# Patient Record
Sex: Male | Born: 2000 | Race: Black or African American | Hispanic: No | Marital: Single | Smoking: Current some day smoker
Health system: Southern US, Community
[De-identification: ages and names within clinical notes are randomized; demographics above are authoritative.]

## PROBLEM LIST (undated history)

## (undated) ENCOUNTER — Emergency Department (HOSPITAL_COMMUNITY): Payer: Medicaid Other

## (undated) DIAGNOSIS — Y249XXA Unspecified firearm discharge, undetermined intent, initial encounter: Secondary | ICD-10-CM

## (undated) DIAGNOSIS — W3400XA Accidental discharge from unspecified firearms or gun, initial encounter: Secondary | ICD-10-CM

## (undated) DIAGNOSIS — R569 Unspecified convulsions: Secondary | ICD-10-CM

## (undated) DIAGNOSIS — Z789 Other specified health status: Secondary | ICD-10-CM

## (undated) HISTORY — PX: ABDOMINAL SURGERY: SHX537

## (undated) HISTORY — PX: BACK SURGERY: SHX140

## (undated) HISTORY — PX: MOLE REMOVAL: SHX2046

## (undated) HISTORY — PX: HERNIA REPAIR: SHX51

---
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
## 2019-01-27 ENCOUNTER — Encounter (HOSPITAL_COMMUNITY): Payer: Self-pay | Admitting: Emergency Medicine

## 2019-01-27 ENCOUNTER — Other Ambulatory Visit: Payer: Self-pay

## 2019-01-27 ENCOUNTER — Emergency Department (HOSPITAL_COMMUNITY)
Admission: EM | Admit: 2019-01-27 | Discharge: 2019-01-27 | Disposition: A | Discharge status: Home / Self Care | Payer: Self-pay | Attending: Emergency Medicine | Admitting: Emergency Medicine

## 2019-01-27 ENCOUNTER — Emergency Department (HOSPITAL_COMMUNITY): Payer: Self-pay

## 2019-01-27 DIAGNOSIS — S00512A Abrasion of oral cavity, initial encounter: Secondary | ICD-10-CM | POA: Insufficient documentation

## 2019-01-27 DIAGNOSIS — Y929 Unspecified place or not applicable: Secondary | ICD-10-CM | POA: Insufficient documentation

## 2019-01-27 DIAGNOSIS — Y9384 Activity, sleeping: Secondary | ICD-10-CM | POA: Insufficient documentation

## 2019-01-27 DIAGNOSIS — R569 Unspecified convulsions: Secondary | ICD-10-CM | POA: Insufficient documentation

## 2019-01-27 DIAGNOSIS — Y999 Unspecified external cause status: Secondary | ICD-10-CM | POA: Insufficient documentation

## 2019-01-27 DIAGNOSIS — Y9389 Activity, other specified: Secondary | ICD-10-CM | POA: Insufficient documentation

## 2019-01-27 DIAGNOSIS — X58XXXA Exposure to other specified factors, initial encounter: Secondary | ICD-10-CM | POA: Insufficient documentation

## 2019-01-27 DIAGNOSIS — Z7722 Contact with and (suspected) exposure to environmental tobacco smoke (acute) (chronic): Secondary | ICD-10-CM | POA: Insufficient documentation

## 2019-01-27 LAB — COMPREHENSIVE METABOLIC PANEL
ALT: 13 U/L (ref 0–44)
AST: 19 U/L (ref 15–41)
Albumin: 4.2 g/dL (ref 3.5–5.0)
Alkaline Phosphatase: 59 U/L (ref 38–126)
Anion gap: 8 (ref 5–15)
BUN: 8 mg/dL (ref 6–20)
CO2: 22 mmol/L (ref 22–32)
Calcium: 9.2 mg/dL (ref 8.9–10.3)
Chloride: 107 mmol/L (ref 98–111)
Creatinine, Ser: 1.07 mg/dL (ref 0.61–1.24)
GFR calc Af Amer: >60 mL/min (ref >60)
GFR calc non Af Amer: >60 mL/min (ref >60)
Glucose, Bld: 101 mg/dL — ABNORMAL HIGH (ref 70–99)
Potassium: 4 mmol/L (ref 3.5–5.1)
Sodium: 137 mmol/L (ref 135–145)
Total Bilirubin: 1.3 mg/dL — ABNORMAL HIGH (ref 0.3–1.2)
Total Protein: 6.8 g/dL (ref 6.5–8.1)

## 2019-01-27 LAB — CBC WITH DIFFERENTIAL/PLATELET
Abs Immature Granulocytes: 0.01 10*3/uL (ref 0.00–0.07)
Basophils Absolute: 0 10*3/uL (ref 0.0–0.1)
Basophils Relative: 1 %
Eosinophils Absolute: 0.1 10*3/uL (ref 0.0–0.5)
Eosinophils Relative: 2 %
HCT: 45.4 % (ref 39.0–52.0)
Hemoglobin: 14.3 g/dL (ref 13.0–17.0)
Immature Granulocytes: 0 %
Lymphocytes Relative: 32 %
Lymphs Abs: 1.6 10*3/uL (ref 0.7–4.0)
MCH: 29.5 pg (ref 26.0–34.0)
MCHC: 31.5 g/dL (ref 30.0–36.0)
MCV: 93.6 fL (ref 80.0–100.0)
Monocytes Absolute: 0.4 10*3/uL (ref 0.1–1.0)
Monocytes Relative: 7 %
Neutro Abs: 2.9 10*3/uL (ref 1.7–7.7)
Neutrophils Relative %: 58 %
Platelets: 241 10*3/uL (ref 150–400)
RBC: 4.85 MIL/uL (ref 4.22–5.81)
RDW: 11 % — ABNORMAL LOW (ref 11.5–15.5)
WBC: 5 10*3/uL (ref 4.0–10.5)
nRBC: 0 % (ref 0.0–0.2)

## 2019-01-27 LAB — URINALYSIS, ROUTINE W REFLEX MICROSCOPIC
Bacteria, UA: NONE SEEN
Bilirubin Urine: NEGATIVE
Glucose, UA: NEGATIVE mg/dL
Ketones, ur: NEGATIVE mg/dL
Nitrite: NEGATIVE
Protein, ur: NEGATIVE mg/dL
Specific Gravity, Urine: 1.017 (ref 1.005–1.030)
pH: 5 (ref 5.0–8.0)

## 2019-01-27 LAB — RAPID URINE DRUG SCREEN, HOSP PERFORMED
Amphetamines: NOT DETECTED
Barbiturates: NOT DETECTED
Benzodiazepines: NOT DETECTED
Cocaine: NOT DETECTED
Opiates: NOT DETECTED
Tetrahydrocannabinol: POSITIVE — AB

## 2019-01-27 LAB — MAGNESIUM: Magnesium: 2.1 mg/dL (ref 1.7–2.4)

## 2019-01-27 LAB — CBG MONITORING, ED: Glucose-Capillary: 95 mg/dL (ref 70–99)

## 2019-01-27 NOTE — ED Notes (Signed)
Pt is NSR on monitor 

## 2019-01-27 NOTE — ED Notes (Signed)
Patient transported to CT 

## 2019-01-27 NOTE — ED Notes (Signed)
IV removed, vitals done, ready for D/C

## 2019-01-27 NOTE — Discharge Instructions (Signed)
You may not drive until cleared by the neurologist.  Do not do other activities that could put yourself in harm's way if you were to have another seizure such as climbing a ladder, swimming in a pool by herself, taking a bath by herself, etc.  If you develop another seizure or seizure-like activity, call 911 or return to the ER for evaluation.

## 2019-01-27 NOTE — ED Notes (Signed)
Discharge instructions discussed with Pt. Pt verbalized understanding. Pt stable and ambulatory. Pt's mother is picking him up.

## 2019-01-27 NOTE — ED Triage Notes (Signed)
Pt presents from home with [facility]. Family found pt with seizure-like activity in bed this morning. EMS noted post-ictal state, blood in mouth (bit tongue), incontinence, foam at mouth. Arrives A/O x4. No h/o seizures. H/o episode after wisdom teeth removal, trouble waking from anaesthesia, required needing a cardiac stent at [facility] d/t this.

## 2019-01-27 NOTE — ED Provider Notes (Signed)
[facility] Provider Note   CSN: [id number] Arrival date & time: 01/27/19  0757    History   Chief Complaint Chief Complaint  Patient presents with  . Seizures    HPI Jonathan [name] is a 18 y.o. male.     HPI 18 year old male presents with seizure.  History obtained from the nurse as EMS is no longer present.  The patient also contributes but does not remember what happened.  He was asleep and girlfriend saw him having seizure-like activity.  He apparently bit his tongue and was initially postictal.  He was foaming at the mouth and incontinent of urine.  No prior history of seizures.  The patient states all he remembers was waking up and EMS was present.  No recent palpitations.  He has not been sick recently including no fever, headache, vomiting, diarrhea, chest pain.  No alcohol use.  He uses marijuana but no other illicit drugs.  Past Medical History:  Diagnosis Date  . Medical history non-contributory     Patient Active Problem List   Diagnosis Date Noted  . Ventricular tachycardia (HCC) 05/23/2015  . Arrhythmia 05/23/2015    Past Surgical History:  Procedure Laterality Date  . HERNIA REPAIR    . MOLE REMOVAL          Home Medications    Prior to Admission medications   Medication Sig Start Date End Date Taking? Authorizing Provider  acetaminophen (TYLENOL) 325 MG tablet Take 2 tablets (650 mg total) by mouth every 6 (six) hours as needed. Patient not taking: Reported on 01/27/2019 12/08/17   Aviva KluverMurray, Alyssa B, PA-C  ibuprofen (ADVIL,MOTRIN) 600 MG tablet Take 1 tablet (600 mg total) by mouth every 6 (six) hours as needed. Patient not taking: Reported on 01/27/2019 12/08/17   Elisha PonderMurray, Alyssa B, PA-C    Family History Family History  Problem Relation Age of Onset  . Heart disease Paternal Grandmother        fluid around heart    Social History Social History   Tobacco Use  . Smoking status: Passive Smoke Exposure -  Never Smoker  . Smokeless tobacco: Never Used  Substance Use Topics  . Alcohol use: No  . Drug use: No     Allergies   Propofol   Review of Systems Review of Systems  Constitutional: Negative for fever.  Cardiovascular: Negative for chest pain and palpitations.  Gastrointestinal: Negative for abdominal pain, diarrhea and vomiting.  Neurological: Positive for seizures. Negative for headaches.  Psychiatric/Behavioral: Negative for sleep disturbance.  All other systems reviewed and are negative.    Physical Exam Updated Vital Signs BP (!) 121/58   Pulse (!) 57   Temp 97.7 F (36.5 C) (Oral)   Resp (!) 29   Ht 5\' 11"  (1.803 m)   Wt 74.8 kg   SpO2 99%   BMI 23.01 kg/m   Physical Exam Vitals signs and nursing note reviewed.  Constitutional:      General: He is not in acute distress.    Appearance: He is well-developed. He is not ill-appearing or diaphoretic.  HENT:     Head: Normocephalic and atraumatic.     Comments: Small right lateral tongue abrasion    Right Ear: External ear normal.     Left Ear: External ear normal.     Nose: Nose normal.  Eyes:     General:        Right eye: No discharge.  Left eye: No discharge.     Extraocular Movements: Extraocular movements intact.     Pupils: Pupils are equal, round, and reactive to light.  Neck:     Musculoskeletal: Neck supple.  Cardiovascular:     Rate and Rhythm: Normal rate and regular rhythm.     Heart sounds: Normal heart sounds.  Pulmonary:     Effort: Pulmonary effort is normal.     Breath sounds: Normal breath sounds.  Abdominal:     Palpations: Abdomen is soft.     Tenderness: There is no abdominal tenderness.  Skin:    General: Skin is warm and dry.  Neurological:     Mental Status: He is alert.     Comments: CN 3-12 grossly intact. 5/5 strength in all 4 extremities. Grossly normal sensation. Normal finger to nose.   Psychiatric:        Mood and Affect: Mood is not anxious.      ED  Treatments / Results  Labs (all labs ordered are listed, but only abnormal results are displayed) Labs Reviewed  CBC WITH DIFFERENTIAL/PLATELET - Abnormal; Notable for the following components:      Result Value   RDW 11.0 (*)    All other components within normal limits  COMPREHENSIVE METABOLIC PANEL - Abnormal; Notable for the following components:   Glucose, Bld 101 (*)    Total Bilirubin 1.3 (*)    All other components within normal limits  URINALYSIS, ROUTINE W REFLEX MICROSCOPIC - Abnormal; Notable for the following components:   APPearance HAZY (*)    Hgb urine dipstick SMALL (*)    Leukocytes,Ua TRACE (*)    All other components within normal limits  RAPID URINE DRUG SCREEN, HOSP PERFORMED - Abnormal; Notable for the following components:   Tetrahydrocannabinol POSITIVE (*)    All other components within normal limits  MAGNESIUM  CBG MONITORING, ED    EKG EKG Interpretation  Date/Time:  Tuesday January 27 2019 08:01:17 EDT Ventricular Rate:  72 PR Interval:    QRS Duration: 101 QT Interval:  389 QTC Calculation: 426 R Axis:   79 Text Interpretation:  Sinus rhythm RSR' in V1 or V2, probably normal variant ST elevation is likely early repol Confirmed by Sherwood Gambler [phone]) on 01/27/2019 8:26:44 AM   Radiology Ct Head Wo Contrast  Result Date: 01/27/2019 CLINICAL DATA:  Seizure. EXAM: CT HEAD WITHOUT CONTRAST TECHNIQUE: Contiguous axial images were obtained from the base of the skull through the vertex without intravenous contrast. COMPARISON:  None. FINDINGS: Brain: No evidence of acute infarction, hemorrhage, hydrocephalus, extra-axial collection or mass lesion/mass effect. Vascular: No hyperdense vessel or unexpected calcification. Skull: Normal. Negative for fracture or focal lesion. Sinuses/Orbits: No acute finding. Other: None. IMPRESSION: No acute intracranial abnormality. Consider further evaluation with MRI, as clinically warranted. Electronically Signed   By:  Davina Poke M.D.   On: 01/27/2019 09:17    Procedures Procedures (including critical care time)  Medications Ordered in ED Medications - No data to display   Initial Impression / Assessment and Plan / ED Course  I have reviewed the triage vital signs and the nursing notes.  Pertinent labs & imaging results that were available during my care of the patient were reviewed by me and considered in my medical decision making (see chart for details).        Work-up is noncontributory towards why he had a first-time seizure.  Neuro exam is benign.  Labs reassuring.  I discussed he cannot drive  for now until cleared by neurology and otherwise we discussed stopping smoking, increasing sleep, and follow-up with neurology.  Discussed return precautions.  Final Clinical Impressions(s) / ED Diagnoses   Final diagnoses:  First time seizure [facility](HCC)    ED Discharge Orders         Ordered    Ambulatory referral to Neurology    Comments: An appointment is requested in approximately: 2 weeks   01/27/19 1127           Pricilla LovelessGoldston, Kikuye Korenek, MD 01/27/19 1129

## 2019-01-27 NOTE — ED Notes (Addendum)
Pt family (mother) updated by pt using room phone. Pt aware of need for urine sample, urinal at bedside. Seizure pads applied

## 2019-01-29 ENCOUNTER — Ambulatory Visit: Payer: Medicaid Other | Admitting: Neurology

## 2019-02-02 ENCOUNTER — Encounter: Payer: Self-pay | Admitting: Neurology

## 2019-02-07 ENCOUNTER — Emergency Department (HOSPITAL_COMMUNITY)
Admission: EM | Admit: 2019-02-07 | Discharge: 2019-02-07 | Disposition: A | Discharge status: Home / Self Care | Payer: Medicaid Other | Attending: Emergency Medicine | Admitting: Emergency Medicine

## 2019-02-07 ENCOUNTER — Encounter (HOSPITAL_COMMUNITY): Payer: Self-pay

## 2019-02-07 ENCOUNTER — Other Ambulatory Visit: Payer: Self-pay

## 2019-02-07 DIAGNOSIS — R569 Unspecified convulsions: Secondary | ICD-10-CM | POA: Insufficient documentation

## 2019-02-07 DIAGNOSIS — Z7722 Contact with and (suspected) exposure to environmental tobacco smoke (acute) (chronic): Secondary | ICD-10-CM | POA: Insufficient documentation

## 2019-02-07 HISTORY — DX: Unspecified convulsions: R56.9

## 2019-02-07 LAB — CBG MONITORING, ED: Glucose-Capillary: 104 mg/dL — ABNORMAL HIGH (ref 70–99)

## 2019-02-07 MED ORDER — LEVETIRACETAM IN NACL 1500 MG/100ML IV SOLN
1500.0000 mg | INTRAVENOUS | Status: AC
  Administered 2019-02-07: 1500 mg via INTRAVENOUS
  Filled 2019-02-07: qty 100

## 2019-02-07 MED ORDER — LEVETIRACETAM 500 MG PO TABS: 500.0000 mg | ORAL_TABLET | Freq: Two times a day (BID) | ORAL | 0 refills | Status: DC

## 2019-02-07 MED ORDER — LORAZEPAM 2 MG/ML IJ SOLN
INTRAMUSCULAR | Status: AC
  Administered 2019-02-07: 2 mg
  Filled 2019-02-07: qty 2

## 2019-02-07 NOTE — ED Notes (Signed)
MD Steinl at bedside. 

## 2019-02-07 NOTE — ED Provider Notes (Addendum)
[facility] EMERGENCY DEPARTMENT Provider Note   CSN: [id number] Arrival date & time: 02/07/19  1100     History   Chief Complaint Chief Complaint  Patient presents with  . Seizures    HPI Jonathan [name] is a 18 y.o. male.     Patient c/o seizure this AM, arrives via EMS. Pt states he awoke this AM per normal, took shower, went back to bed, and was noted to have approximately 2-3 minute generalized seizure while in bed. Patient was briefly post-ictal, but now has returned to baseline normal mental status. No incontinence or oral injury. Denies sleep deprivation. Has been eating/drinking per normal. No recent wt change. Denies fever/chills. No uri symptoms. No dysuria or gu c/o. No nvd. Denies recent head trauma. No headache. No change in speech or vision. No numbness,weakness, or loss of normal functional ability. Remote hx ablation for svt - no recurrent episodes since. No palpitations or syncope. Pt was seen in ED 01/27/19 after his first seizure, ct then neg, labs unremarkable except UDS + THC.   The history is provided by the patient.  Seizures   Past Medical History:  Diagnosis Date  . Medical history non-contributory   . Seizures [facility])     Patient Active Problem List   Diagnosis Date Noted  . Ventricular tachycardia ([place]) 05/23/2015  . Arrhythmia 05/23/2015    Past Surgical History:  Procedure Laterality Date  . HERNIA REPAIR    . MOLE REMOVAL          Home Medications    Prior to Admission medications   Medication Sig Start Date End Date Taking? Authorizing Provider  acetaminophen (TYLENOL) 325 MG tablet Take 2 tablets (650 mg total) by mouth every 6 (six) hours as needed. Patient not taking: Reported on 01/27/2019 12/08/17   Langston Masker B, PA-C  ibuprofen (ADVIL,MOTRIN) 600 MG tablet Take 1 tablet (600 mg total) by mouth every 6 (six) hours as needed. Patient not taking: Reported on 01/27/2019 12/08/17   Albesa Seen, PA-C    Family  History Family History  Problem Relation Age of Onset  . Heart disease Paternal Grandmother        fluid around heart    Social History Social History   Tobacco Use  . Smoking status: Passive Smoke Exposure - Never Smoker  . Smokeless tobacco: Never Used  Substance Use Topics  . Alcohol use: No  . Drug use: No     Allergies   Propofol   Review of Systems Review of Systems  Constitutional: Negative for chills and fever.  HENT: Negative for sore throat.   Eyes: Negative for pain and visual disturbance.  Respiratory: Negative for cough and shortness of breath.   Cardiovascular: Negative for chest pain.  Gastrointestinal: Negative for abdominal pain, nausea and vomiting.  Endocrine: Negative for polyuria.  Genitourinary: Negative for dysuria and flank pain.  Musculoskeletal: Negative for back pain and neck pain.  Skin: Negative for rash and wound.  Neurological: Positive for seizures. Negative for speech difficulty, weakness, numbness and headaches.  Hematological: Does not bruise/bleed easily.  Psychiatric/Behavioral: Negative for confusion.     Physical Exam Updated Vital Signs BP 128/65   Pulse 80   Temp 97.9 F (36.6 C) (Oral)   Resp 15   Ht 1.803 m (5\' 11" )   Wt 74.8 kg   SpO2 96%   BMI 23.01 kg/m   Physical Exam Vitals signs and nursing note reviewed.  Constitutional:  Appearance: Normal appearance. He is well-developed.  HENT:     Head: Atraumatic.     Nose: Nose normal.     Mouth/Throat:     Mouth: Mucous membranes are moist.     Pharynx: Oropharynx is clear.  Eyes:     General: No scleral icterus.    Conjunctiva/sclera: Conjunctivae normal.     Pupils: Pupils are equal, round, and reactive to light.  Neck:     Musculoskeletal: Normal range of motion and neck supple. No neck rigidity or muscular tenderness.     Vascular: No carotid bruit.     Trachea: No tracheal deviation.  Cardiovascular:     Rate and Rhythm: Normal rate and regular  rhythm.     Pulses: Normal pulses.     Heart sounds: Normal heart sounds. No murmur. No friction rub. No gallop.   Pulmonary:     Effort: Pulmonary effort is normal. No accessory muscle usage or respiratory distress.     Breath sounds: Normal breath sounds.  Abdominal:     General: Bowel sounds are normal. There is no distension.     Palpations: Abdomen is soft. There is no mass.     Tenderness: There is no abdominal tenderness. There is no guarding or rebound.     Hernia: No hernia is present.  Genitourinary:    Comments: No cva tenderness. Musculoskeletal:        General: No swelling or tenderness.     Right lower leg: No edema.     Left lower leg: No edema.     Comments: CTLS spine, non tender, aligned, no step off. Good rom bil ext without pain or focal bony tenderness.   Skin:    General: Skin is warm and dry.     Findings: No rash.  Neurological:     Mental Status: He is alert.     Comments: Alert, speech clear/fluent. Oriented. Motor intact bil, stre 5/5. sens grossly intact. Steady gait.    Psychiatric:        Mood and Affect: Mood normal.      ED Treatments / Results  Labs (all labs ordered are listed, but only abnormal results are displayed) Labs Reviewed - No data to display  EKG None  Radiology No results found.  Procedures Procedures (including critical care time)  Medications Ordered in ED Medications - No data to display   Initial Impression / Assessment and Plan / ED Course  I have reviewed the triage vital signs and the nursing notes.  Pertinent labs & imaging results that were available during my care of the patient were reviewed by me and considered in my medical decision making (see chart for details).  Seizure precautions. Monitor.   Reviewed nursing notes and prior charts for additional history. Recent ED eval for same reviewed - ct head and blood work unremarkable that visit.   Neurology consulted.   While awaiting neurology call  back, patient with generalized tonic-clonic seizure in ED, while on stretcher. No incontinence. No fall. Ativan 2 mg iv. Seizure lasted approx 1-2 minutes. Patient postictal.  Discussed pt with neurologist, Dr Otelia LimesLindzen, including patients 2nd and 3rd seizures today, prior workup, pt wt - he indicates to give keppra 1500 mg iv, and start on 500 mg bid. He can f/u with neurology as outpt, outpt eeg, seizure precautions/no driving.   Discussed w pt, avoid any drug use, including thc (as well as any other synthetic drugs, kraton, spice, etc).  Also instructed no driving,  no operating heavy machinery, no swimming, until cleared to do so by neurology.   Mom indicates patient had appt w neurology last week, but that he skipped appt. She indicates she will ensure he makes and goes to new appt this week. Also discussed need to avoid thc, or any drug use as they can cause seizures.   Pt ambulatory in ED. No distress. Awake and alert. Normal vitals.   CRITICAL CARE RE: recurrent seizures in ED requiring IV seizure medication.  Performed by: Suzi RootsKevin E Aruna Nestler Total critical care time: 40 minutes Critical care time was exclusive of separately billable procedures and treating other patients. Critical care was necessary to treat or prevent imminent or life-threatening deterioration. Critical care was time spent personally by me on the following activities: development of treatment plan with patient and/or surrogate as well as nursing, discussions with consultants, evaluation of patient's response to treatment, examination of patient, obtaining history from patient or surrogate, ordering and performing treatments and interventions, ordering and review of laboratory studies, ordering and review of radiographic studies, pulse oximetry and re-evaluation of patient's condition.    Final Clinical Impressions(s) / ED Diagnoses   Final diagnoses:  None    ED Discharge Orders    None           Cathren LaineSteinl, Blake Vetrano, MD  02/07/19 [phone]

## 2019-02-07 NOTE — ED Notes (Addendum)
Pt asked for a sip of water, this RN went to get water he could rinse his mouth with since he had recently bit his tongue and when this RN walked back into the room pt was actively seizing & it was 1125. Yankauer suction was started to his mouth due to blood noted with saliva draining from his mouth, Mina PA was told this RN needed help & she ordered 4 mg IV ativan to be pulled & 2 mg of the Ativan to be given to see how that affects (given at 1134). Pt was moving around, flailing his whole body around on the bed while grabbing for things instead of just flinging his hands across things, no eye contact, no convulsion/tremor like movements noted. By 1140 pt was still, asleep & VSS.

## 2019-02-07 NOTE — Discharge Instructions (Signed)
It was our pleasure to provide your ER care today - we hope that you feel better.  Take keppra (seizure medication) as prescribed.   Avoid using marijuana, spice, kratom, or any other drug as they can lower the seizure threshold and cause seizures.   No driving, operating heavy machinery, swimming, climbing ladders, or other potentially dangerous activity until cleared to do so by your doctor/neurologist.   Follow up with neurologist in their office this coming week - call Monday AM to arrange appointment.   Return to ER if worse, recurrent seizures, fevers, new or severe pain, or other concern.

## 2019-02-07 NOTE — ED Triage Notes (Signed)
Pt stated that he took a shower and then laid down to sleep & he woke up in the ambulance. EMS reported that he was seen by family having a grad mal seizure that lasted approx. 3 min, this is reported to be his 2nd seizure he has ever had. Upon pts arrival to ED he is A/Ox4.

## 2019-02-11 ENCOUNTER — Ambulatory Visit (INDEPENDENT_AMBULATORY_CARE_PROVIDER_SITE_OTHER): Payer: Self-pay | Admitting: Internal Medicine

## 2019-02-11 ENCOUNTER — Other Ambulatory Visit: Payer: Self-pay | Admitting: Internal Medicine

## 2019-02-11 ENCOUNTER — Other Ambulatory Visit: Payer: Self-pay

## 2019-02-11 ENCOUNTER — Encounter: Payer: Self-pay | Admitting: Internal Medicine

## 2019-02-11 DIAGNOSIS — Z79899 Other long term (current) drug therapy: Secondary | ICD-10-CM

## 2019-02-11 DIAGNOSIS — I471 Supraventricular tachycardia: Secondary | ICD-10-CM

## 2019-02-11 DIAGNOSIS — Z8679 Personal history of other diseases of the circulatory system: Secondary | ICD-10-CM

## 2019-02-11 DIAGNOSIS — G40909 Epilepsy, unspecified, not intractable, without status epilepticus: Secondary | ICD-10-CM

## 2019-02-11 NOTE — Progress Notes (Signed)
   CC: Establish care, seizure disorder (new diagnosis).  HPI:  [name] is a 18 y.o. with no significant past medical history presenting to establish care and also follow-up after experiencing new onset seizures.  Please see problem based charting for further details.  Past Medical History:  Diagnosis Date  . Medical history non-contributory   . Seizures ([place])    Review of Systems:   Review of Systems  Constitutional: Negative.   HENT: Negative.   Eyes: Negative.   Respiratory: Negative.   Cardiovascular: Negative.   Gastrointestinal: Negative.   Skin: Negative.   Neurological: Positive for seizures.  Psychiatric/Behavioral: Negative.    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  Physical Exam:  Vitals:   02/11/19 1531  BP: 127/66  Pulse: 61  Temp: 98.6 F (37 C)  TempSrc: Oral  SpO2: 100%  Weight: 162 lb 9.6 oz (73.8 kg)   Physical Exam Vitals signs and nursing note reviewed.  Constitutional:      Appearance: Normal appearance. He is normal weight.  HENT:     Head: Normocephalic and atraumatic.     Nose: Nose normal.  Eyes:     Conjunctiva/sclera: Conjunctivae normal.  Neck:     Musculoskeletal: Normal range of motion and neck supple.  Cardiovascular:     Rate and Rhythm: Normal rate.     Heart sounds: Normal heart sounds. No murmur. No gallop.   Pulmonary:     Effort: Pulmonary effort is normal.     Breath sounds: Normal breath sounds. No wheezing or rhonchi.  Neurological:     General: No focal deficit present.     Mental Status: He is alert and oriented to person, place, and time. Mental status is at baseline.     Cranial Nerves: No cranial nerve deficit.     Sensory: No sensory deficit.     Motor: No  weakness.     Gait: Gait normal.  Psychiatric:     Comments: Frustrated after arguing with mom.      Assessment & Plan:   See Encounters Tab for problem based charting.  Patient discussed with Dr. Evette Doffing

## 2019-02-11 NOTE — Assessment & Plan Note (Addendum)
Seizure disorder (new diagnosis): Mr. Parenteau was in his usual state of health until January 27, 2019 when he experienced general tonic-clonic seizure while he was at his girlfriend's house.  During that episode he had urinary incontinence and tongue laceration.  He presented to the emergency department following this episode and in the ED, CMP was unremarkable, CBC was unremarkable.  CT head showed no acute intracranial abnormalities.  He was fine and actually went to play basketball 2 days after the incident.  On February 07, 2019 he had another seizure episode and presented to the emergency department by EMS.  While in the ED he had a subsequent seizure lasting for about 1 to 2 minutes, generalized tonic-clonic.  He received Ativan 2 mg.  Neurology was consulted and advised to start patient on IV Keppra load of 1500 mg and maintenance dose of Keppra 500 mg oral twice daily.  Since then, he has been compliant with his medication and has had no subsequent seizure-like disorders.  He denies headaches, chest pain, abdominal pain, blurry vision, weakness, lightheadedness.  Plan: -Continue Keppra 500 mg twice daily - Follow-up appointment with neurology (02/12/2019) -Advised to avoid driving and triggers to seizure disorder including sleep deprivation etc

## 2019-02-11 NOTE — Assessment & Plan Note (Signed)
Hx of pSVT & wide-complex tachycardia: In 2016, he experienced paroxysmal SVT and wide-complex tachycardia after he had undergone oral surgery.  Per chart review, this episode had lasted for more than 2 hours.  Echocardiogram was unremarkable and he has no family history of sudden cardiac death, cardiac arrhythmias, Brugada or any cardiac abnormalities.  In January 2017, he underwent ablation of accessory pathway at [facility].  Since then, he has not had any subsequent episodes of tachycardia.  He denies shortness of breath, chest pain, lightheadedness, dizziness or syncope.

## 2019-02-11 NOTE — Assessment & Plan Note (Signed)
" >>  ASSESSMENT AND PLAN FOR ARRHYTHMIA WRITTEN ON 02/11/2019  4:21 PM BY Shawnelle Spoerl K, MD  Hx of pSVT & wide-complex tachycardia: In 2016, he experienced paroxysmal SVT and wide-complex tachycardia after he had undergone oral surgery.  Per chart review, this episode had lasted for more than 2 hours.  Echocardiogram was unremarkable and he has no family history of sudden cardiac death, cardiac arrhythmias, Brugada or any cardiac abnormalities.  In January 2017, he underwent ablation of accessory pathway at [facility].  Since then, he has not had any subsequent episodes of tachycardia.  He denies shortness of breath, chest pain, lightheadedness, dizziness or syncope. "

## 2019-02-11 NOTE — Patient Instructions (Addendum)
Hi Jonathan [name],  It was a pleasure seeing you here at the clinic today and also looking forward to be your primary doctor.  Overall, you seem to be doing very well and I am glad that you are taking your seizure medications.  Also, please don't drive at least for the next 6 months or until you see the neurologist  Please keep the neurology appointment tomorrow and follow-up with them as recommended.  Take care of yourself, continue to exercise, continue to play basketball and come and see [place] if anything changes.  Take Care! Dr. Eileen Stanford  Please call the internal medicine center clinic if you have any questions or concerns, we may be able to help and keep you from a long and expensive emergency room wait. Our clinic and after hours phone number is [phone], the best time to call is Monday through Friday 9 am to 4 pm but there is always someone available 24/7 if you have an emergency. If you need medication refills please notify your pharmacy one week in advance and they will send [place] a request.

## 2019-02-12 ENCOUNTER — Encounter: Payer: Self-pay | Admitting: Neurology

## 2019-02-12 ENCOUNTER — Telehealth: Payer: Self-pay | Admitting: Neurology

## 2019-02-12 ENCOUNTER — Ambulatory Visit: Payer: Self-pay | Admitting: Neurology

## 2019-02-12 VITALS — BP 132/71 | HR 67 | Temp 97.6°F | Ht 72.0 in | Wt 164.0 lb

## 2019-02-12 DIAGNOSIS — R569 Unspecified convulsions: Secondary | ICD-10-CM

## 2019-02-12 MED ORDER — LEVETIRACETAM 500 MG PO TABS: 500.0000 mg | ORAL_TABLET | Freq: Two times a day (BID) | ORAL | 1 refills | Status: DC

## 2019-02-12 NOTE — Progress Notes (Signed)
Internal Medicine Clinic Attending  Case discussed with Dr. Agyei at the time of the visit.  We reviewed the resident's history and exam and pertinent patient test results.  I agree with the assessment, diagnosis, and plan of care documented in the resident's note.    

## 2019-02-12 NOTE — Telephone Encounter (Signed)
self pay order sent to GI. They will reach out to the patient to schedule.  °

## 2019-02-12 NOTE — Progress Notes (Signed)
Reason for visit: Seizures  Referring physician: Arial  Rod HollerChristian R [name] is a 18 y.o. male  History of present illness:  Jonathan [name] is a 18 year old right-handed black male with a history of 2 recent seizures.  The patient had a seizure on 27 January 2019 while in bed early in the morning, the second seizure occurred on 07 February 2019 when he was awake, he was about ready to help his girlfriend move some furniture.  The patient was noted to suddenly blackout without warning, he had generalized jerking and bit his tongue.  The first seizure was associated with urinary incontinence.  The patient reports no family history of seizures and he denies any prior history of head trauma.  He did undergo a CT scan of the brain in the emergency room that was unremarkable, urine drug screen was positive for THC but otherwise unremarkable.  The patient reports no headaches, dizziness, numbness or weakness of extremities, or any balance changes.  He has never had a seizure event prior to 21 July.  He is sent to this office for an evaluation.  He has been placed on Keppra and he is tolerating the medication well.  He works as a Conservation officer, naturecashier at [employer].  Past Medical History:  Diagnosis Date  . Medical history non-contributory   . Seizures ([facility])     Past Surgical History:  Procedure Laterality Date  . HERNIA REPAIR    . MOLE REMOVAL      Family History  Problem Relation Age of Onset  . Heart disease Paternal Grandmother        fluid around heart    Social history:  reports that he has been smoking cigars. He has never used smokeless tobacco. He reports current drug use. Drug: Marijuana. He reports that he does not drink alcohol.  Medications:  Prior to Admission medications   Medication Sig Start Date End Date Taking? Authorizing Provider  acetaminophen (TYLENOL) 325 MG tablet Take 2 tablets (650 mg total) by mouth every 6 (six) hours as needed. 12/08/17  Yes Dayton ScrapeMurray, Alyssa B, PA-C  ibuprofen  (ADVIL,MOTRIN) 600 MG tablet Take 1 tablet (600 mg total) by mouth every 6 (six) hours as needed. 12/08/17  Yes [place] ScrapeMurray, Alyssa B, PA-C  levETIRAcetam (KEPPRA) 500 MG tablet Take 1 tablet (500 mg total) by mouth 2 (two) times daily. 02/07/19  Yes Cathren LaineSteinl, Kevin, MD      Allergies  Allergen Reactions  . Propofol Other (See Comments)    Arrhythmias resulting in hospitalization     ROS:  Out of a complete 14 system review of symptoms, the patient complains only of the following symptoms, and all other reviewed systems are negative.  Seizure  Blood pressure 132/71, pulse 67, temperature 97.6 F (36.4 C), height 6' (1.829 m), weight 164 lb (74.4 kg).  Physical Exam  General: The patient is alert and cooperative at the time of the examination.  Eyes: Pupils are equal, round, and reactive to light. Discs are flat bilaterally.  Neck: The neck is supple, no carotid bruits are noted.  Respiratory: The respiratory examination is clear.  Cardiovascular: The cardiovascular examination reveals a regular rate and rhythm, no obvious murmurs or rubs are noted.  Skin: Extremities are without significant edema.  Neurologic Exam  Mental status: The patient is alert and oriented x 3 at the time of the examination. The patient has apparent normal recent and remote memory, with an apparently normal attention span and concentration ability.  Cranial nerves: Facial symmetry is present. There is good sensation of the face to pinprick and soft touch bilaterally. The strength of the facial muscles and the muscles to head turning and shoulder shrug are normal bilaterally. Speech is well enunciated, no aphasia or dysarthria is noted. Extraocular movements are full. Visual fields are full. The tongue is midline, and the patient has symmetric elevation of the soft palate. No obvious hearing deficits are noted.  Motor: The motor testing reveals 5 over 5 strength of all 4 extremities. Good symmetric motor tone is  noted throughout.  Sensory: Sensory testing is intact to pinprick, soft touch, vibration sensation, and position sense on all 4 extremities. No evidence of extinction is noted.  Coordination: Cerebellar testing reveals good finger-nose-finger and heel-to-shin bilaterally.  Gait and station: Gait is normal. Tandem gait is normal. Romberg is negative. No drift is seen.  Reflexes: Deep tendon reflexes are symmetric and normal bilaterally. Toes are downgoing bilaterally.   Assessment/Plan:  1.  New onset seizures  The patient will remain on Keppra, prescription for the Keppra was sent in.  We will check MRI of the brain with and without gadolinium enhancement, he will have an EEG study.  He will follow-up in 4 months.  He is not to drive for 6 months following the last seizure.   Jill Alexanders MD 02/12/2019 3:30 PM  [facility] [address] [place], [place] [address]  Phone [phone] Fax [phone]

## 2019-02-25 ENCOUNTER — Ambulatory Visit: Payer: Medicaid Other | Admitting: Neurology

## 2019-03-02 ENCOUNTER — Other Ambulatory Visit: Payer: Self-pay

## 2019-03-02 ENCOUNTER — Encounter: Payer: Self-pay | Admitting: Neurology

## 2019-03-04 ENCOUNTER — Ambulatory Visit: Payer: Medicaid Other

## 2019-04-06 ENCOUNTER — Ambulatory Visit: Payer: Self-pay

## 2019-04-23 ENCOUNTER — Emergency Department (HOSPITAL_COMMUNITY)
Admission: EM | Admit: 2019-04-23 | Discharge: 2019-04-23 | Disposition: A | Discharge status: Home / Self Care | Payer: Medicaid Other | Attending: Emergency Medicine | Admitting: Emergency Medicine

## 2019-04-23 ENCOUNTER — Other Ambulatory Visit: Payer: Self-pay

## 2019-04-23 DIAGNOSIS — Z9114 Patient's other noncompliance with medication regimen: Secondary | ICD-10-CM | POA: Insufficient documentation

## 2019-04-23 DIAGNOSIS — F17290 Nicotine dependence, other tobacco product, uncomplicated: Secondary | ICD-10-CM | POA: Insufficient documentation

## 2019-04-23 DIAGNOSIS — R569 Unspecified convulsions: Secondary | ICD-10-CM | POA: Insufficient documentation

## 2019-04-23 DIAGNOSIS — Z79899 Other long term (current) drug therapy: Secondary | ICD-10-CM | POA: Insufficient documentation

## 2019-04-23 LAB — BASIC METABOLIC PANEL
Anion gap: 16 — ABNORMAL HIGH (ref 5–15)
BUN: 15 mg/dL (ref 6–20)
CO2: 21 mmol/L — ABNORMAL LOW (ref 22–32)
Calcium: 9.7 mg/dL (ref 8.9–10.3)
Chloride: 101 mmol/L (ref 98–111)
Creatinine, Ser: 1.18 mg/dL (ref 0.61–1.24)
GFR calc Af Amer: >60 mL/min (ref >60)
GFR calc non Af Amer: >60 mL/min (ref >60)
Glucose, Bld: 94 mg/dL (ref 70–99)
Potassium: 3.5 mmol/L (ref 3.5–5.1)
Sodium: 138 mmol/L (ref 135–145)

## 2019-04-23 LAB — CBC WITH DIFFERENTIAL/PLATELET
Abs Immature Granulocytes: 0.05 10*3/uL (ref 0.00–0.07)
Basophils Absolute: 0 10*3/uL (ref 0.0–0.1)
Basophils Relative: 0 %
Eosinophils Absolute: 0 10*3/uL (ref 0.0–0.5)
Eosinophils Relative: 0 %
HCT: 41.8 % (ref 39.0–52.0)
Hemoglobin: 13.5 g/dL (ref 13.0–17.0)
Immature Granulocytes: 1 %
Lymphocytes Relative: 14 %
Lymphs Abs: 1.3 10*3/uL (ref 0.7–4.0)
MCH: 29.7 pg (ref 26.0–34.0)
MCHC: 32.3 g/dL (ref 30.0–36.0)
MCV: 91.9 fL (ref 80.0–100.0)
Monocytes Absolute: 0.7 10*3/uL (ref 0.1–1.0)
Monocytes Relative: 8 %
Neutro Abs: 7.2 10*3/uL (ref 1.7–7.7)
Neutrophils Relative %: 77 %
Platelets: 271 10*3/uL (ref 150–400)
RBC: 4.55 MIL/uL (ref 4.22–5.81)
RDW: 11.3 % — ABNORMAL LOW (ref 11.5–15.5)
WBC: 9.2 10*3/uL (ref 4.0–10.5)
nRBC: 0 % (ref 0.0–0.2)

## 2019-04-23 MED ORDER — LEVETIRACETAM 500 MG PO TABS: 500.0000 mg | ORAL_TABLET | Freq: Two times a day (BID) | ORAL | 1 refills | Status: DC

## 2019-04-23 MED ORDER — SODIUM CHLORIDE 0.9 % IV BOLUS
1000.0000 mL | Freq: Once | INTRAVENOUS | Status: AC
  Administered 2019-04-23: 21:00:00 1000 mL via INTRAVENOUS

## 2019-04-23 MED ORDER — LEVETIRACETAM IN NACL 1000 MG/100ML IV SOLN
1000.0000 mg | Freq: Once | INTRAVENOUS | Status: AC
  Administered 2019-04-23: 1000 mg via INTRAVENOUS
  Filled 2019-04-23: qty 100

## 2019-04-23 NOTE — ED Triage Notes (Signed)
Pt BIB EMS with 2 seizures at his GF's house. Pt out of seizure upon EMS arrival and AOx4. Pt recently dx with seizure disorder and under eval for the same. Pt AOx4 upon arrival. Pt states he takes seizure  medicine but does not recall name.

## 2019-04-23 NOTE — ED Provider Notes (Signed)
[facility] EMERGENCY DEPARTMENT Provider Note   CSN: [id number] Arrival date & time: 04/23/19  2035     History   Chief Complaint Chief Complaint  Patient presents with   Seizures    HPI Jonathan [name] is a 18 y.o. male.     HPI  18 year old male presents with seizure.  Patient remembers taking a nap and then waking up with the ambulance there.  Friend saw him have a seizure episode.  The patient was diagnosed with first-time seizures a couple months ago.  Seen in the ED again and started on Keppra.  He has been followed with neurology.  Due to some insurance issues and cost, over the past month or so he has only been taking the morning dose of Keppra.  He states his insurance is now better and he should be able to take the twice daily dosing.  He states he felt warm with a mild headache after the seizure but this is all gone when they turned the air conditioning on.  Has not been ill recently.  Past Medical History:  Diagnosis Date   Medical history non-contributory    Seizures [facility])     Patient Active Problem List   Diagnosis Date Noted   Seizure disorder (HCC) 02/11/2019   Arrhythmia 05/23/2015    Past Surgical History:  Procedure Laterality Date   HERNIA REPAIR     MOLE REMOVAL          Home Medications    Prior to Admission medications   Medication Sig Start Date End Date Taking? Authorizing Provider  acetaminophen (TYLENOL) 325 MG tablet Take 2 tablets (650 mg total) by mouth every 6 (six) hours as needed. Patient not taking: Reported on 04/23/2019 12/08/17   Aviva Kluver B, PA-C  ibuprofen (ADVIL,MOTRIN) 600 MG tablet Take 1 tablet (600 mg total) by mouth every 6 (six) hours as needed. Patient not taking: Reported on 04/23/2019 12/08/17   Aviva Kluver B, PA-C  levETIRAcetam (KEPPRA) 500 MG tablet Take 1 tablet (500 mg total) by mouth 2 (two) times daily. 04/23/19 06/22/19  Pricilla Loveless, MD    Family History Family History   Problem Relation Age of Onset   Heart disease Paternal Grandmother        fluid around heart    Social History Social History   Tobacco Use   Smoking status: Light Tobacco Smoker    Types: Cigars   Smokeless tobacco: Never Used   Tobacco comment: black and mild, 1 daily  Substance Use Topics   Alcohol use: No   Drug use: Yes    Types: Marijuana    Comment: last used on 02/07/19     Allergies   Propofol   Review of Systems Review of Systems  Constitutional: Negative for fever.  Respiratory: Negative for cough and shortness of breath.   Cardiovascular: Negative for chest pain.  Gastrointestinal: Negative for vomiting.  Neurological: Positive for seizures. Negative for weakness and numbness.  All other systems reviewed and are negative.    Physical Exam Updated Vital Signs BP 139/75    Pulse 73    Temp 98 F (36.7 C)    Resp 14    Ht 5\' 11"  (1.803 m)    Wt 73.5 kg    SpO2 99%    BMI 22.59 kg/m   Physical Exam Vitals signs and nursing note reviewed.  Constitutional:      General: He is not in acute distress.    Appearance:  He is well-developed. He is not ill-appearing or diaphoretic.  HENT:     Head: Normocephalic and atraumatic.     Right Ear: External ear normal.     Left Ear: External ear normal.     Nose: Nose normal.  Eyes:     General:        Right eye: No discharge.        Left eye: No discharge.     Extraocular Movements: Extraocular movements intact.     Pupils: Pupils are equal, round, and reactive to light.  Neck:     Musculoskeletal: Neck supple.  Cardiovascular:     Rate and Rhythm: Normal rate and regular rhythm.     Heart sounds: Normal heart sounds.  Pulmonary:     Effort: Pulmonary effort is normal.     Breath sounds: Normal breath sounds.  Abdominal:     Palpations: Abdomen is soft.     Tenderness: There is no abdominal tenderness.  Skin:    General: Skin is warm and dry.  Neurological:     Mental Status: He is alert.      Comments: CN 3-12 grossly intact. 5/5 strength in all 4 extremities. Grossly normal sensation. Normal finger to nose.   Psychiatric:        Mood and Affect: Mood is not anxious.      ED Treatments / Results  Labs (all labs ordered are listed, but only abnormal results are displayed) Labs Reviewed  BASIC METABOLIC PANEL - Abnormal; Notable for the following components:      Result Value   CO2 21 (*)    Anion gap 16 (*)    All other components within normal limits  CBC WITH DIFFERENTIAL/PLATELET - Abnormal; Notable for the following components:   RDW 11.3 (*)    All other components within normal limits    EKG None  Radiology No results found.  Procedures Procedures (including critical care time)  Medications Ordered in ED Medications  levETIRAcetam (KEPPRA) IVPB 1000 mg/100 mL premix (0 mg Intravenous Stopped 04/23/19 2135)  sodium chloride 0.9 % bolus 1,000 mL (0 mLs Intravenous Stopped 04/23/19 2254)     Initial Impression / Assessment and Plan / ED Course  I have reviewed the triage vital signs and the nursing notes.  Pertinent labs & imaging results that were available during my care of the patient were reviewed by me and considered in my medical decision making (see chart for details).        Patient's breakthrough seizure is likely due to taking the seizure meds incorrectly.  I discussed that it is very important he take the medicine as instructed twice daily.  His mom is in agreement.  They are asking for prescription again and they feel like they can fill it and take it appropriately. Patient's anion gap is likely from seizure given that otherwise, appear stable for discharge home.   Final Clinical Impressions(s) / ED Diagnoses   Final diagnoses:  Seizure [facility])    ED Discharge Orders         Ordered    levETIRAcetam (KEPPRA) 500 MG tablet  2 times daily     04/23/19 2234           Sherwood Gambler, MD 04/23/19 2342

## 2019-04-28 ENCOUNTER — Telehealth: Payer: Self-pay

## 2019-04-28 NOTE — Telephone Encounter (Signed)
I reached out to the pt and we were able to schedule f/u for 05/14/2019 with Clarise Cruz at 815 am.

## 2019-04-28 NOTE — Telephone Encounter (Signed)
-----   Message from Kathrynn Ducking, MD sent at 04/24/2019  9:50 AM EDT ----- This patient had a recent seizure, he is scheduled to see Judson Roch in December, please try to move up the appointment within the next couple weeks.  Thank you.

## 2019-05-03 IMAGING — DX DG ANKLE COMPLETE 3+V*L*
3 series · 3 of 3 positions shown · non-contrast
Comparison: None.

CLINICAL DATA: Left ankle pain after twisting injury playing
basketball earlier today. Heard a pop. Unable to bear weight.
Swelling.

EXAM:
LEFT ANKLE COMPLETE - 3+ VIEW

[ankle ap]
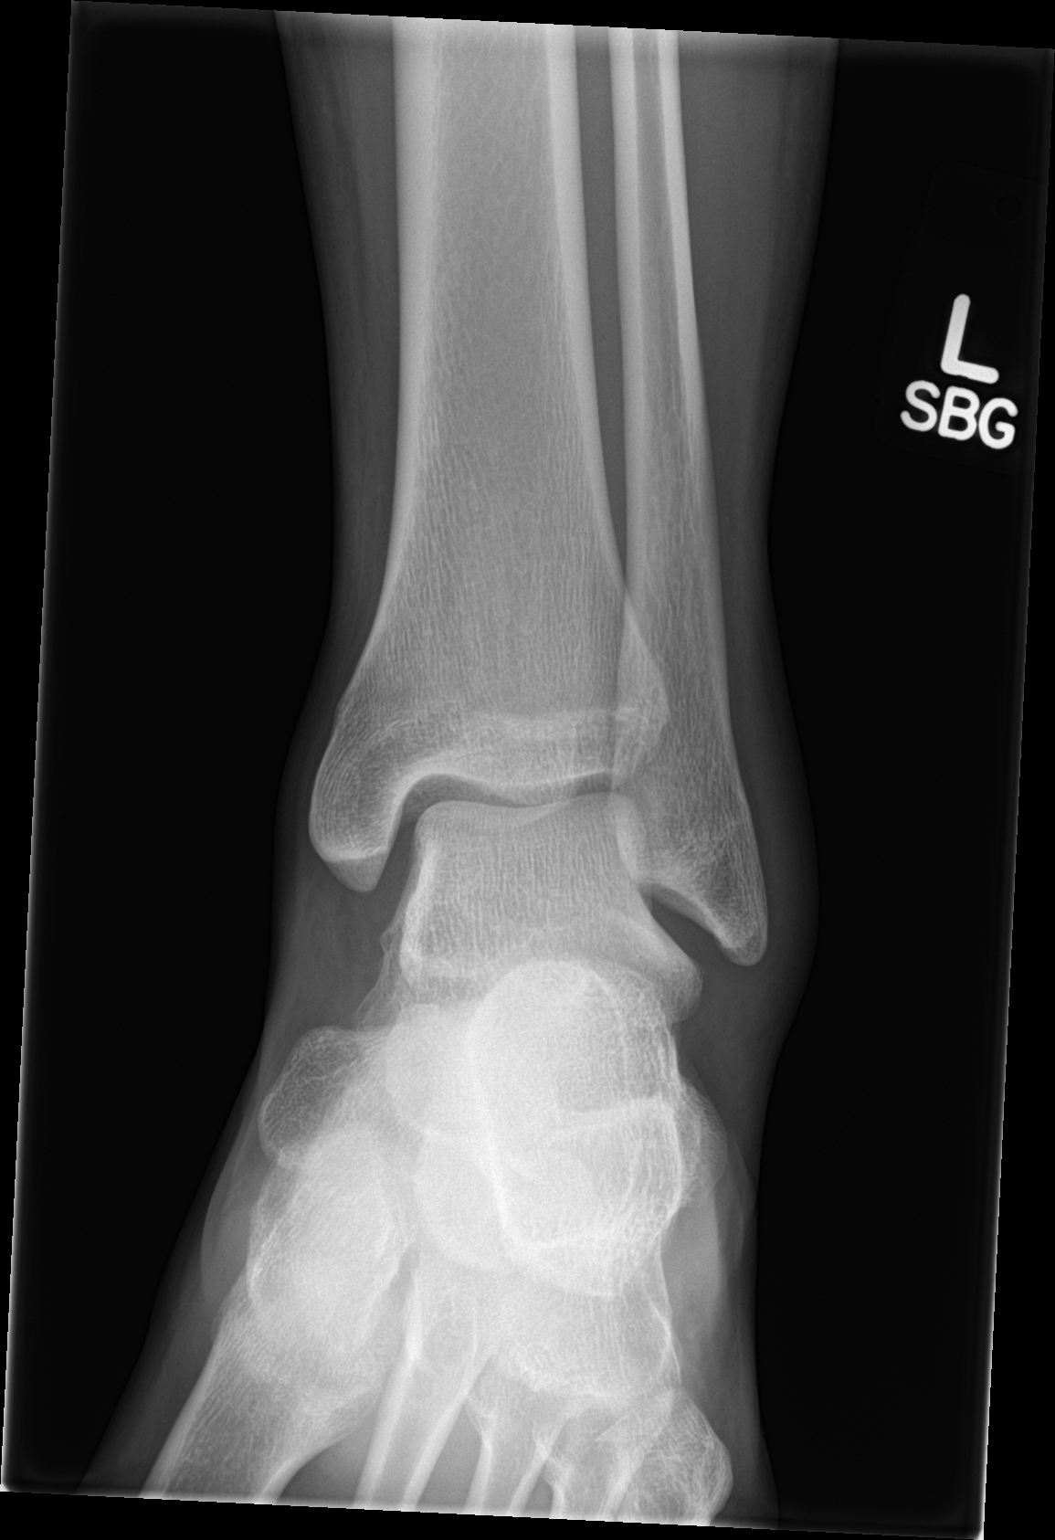

[ankle obl]
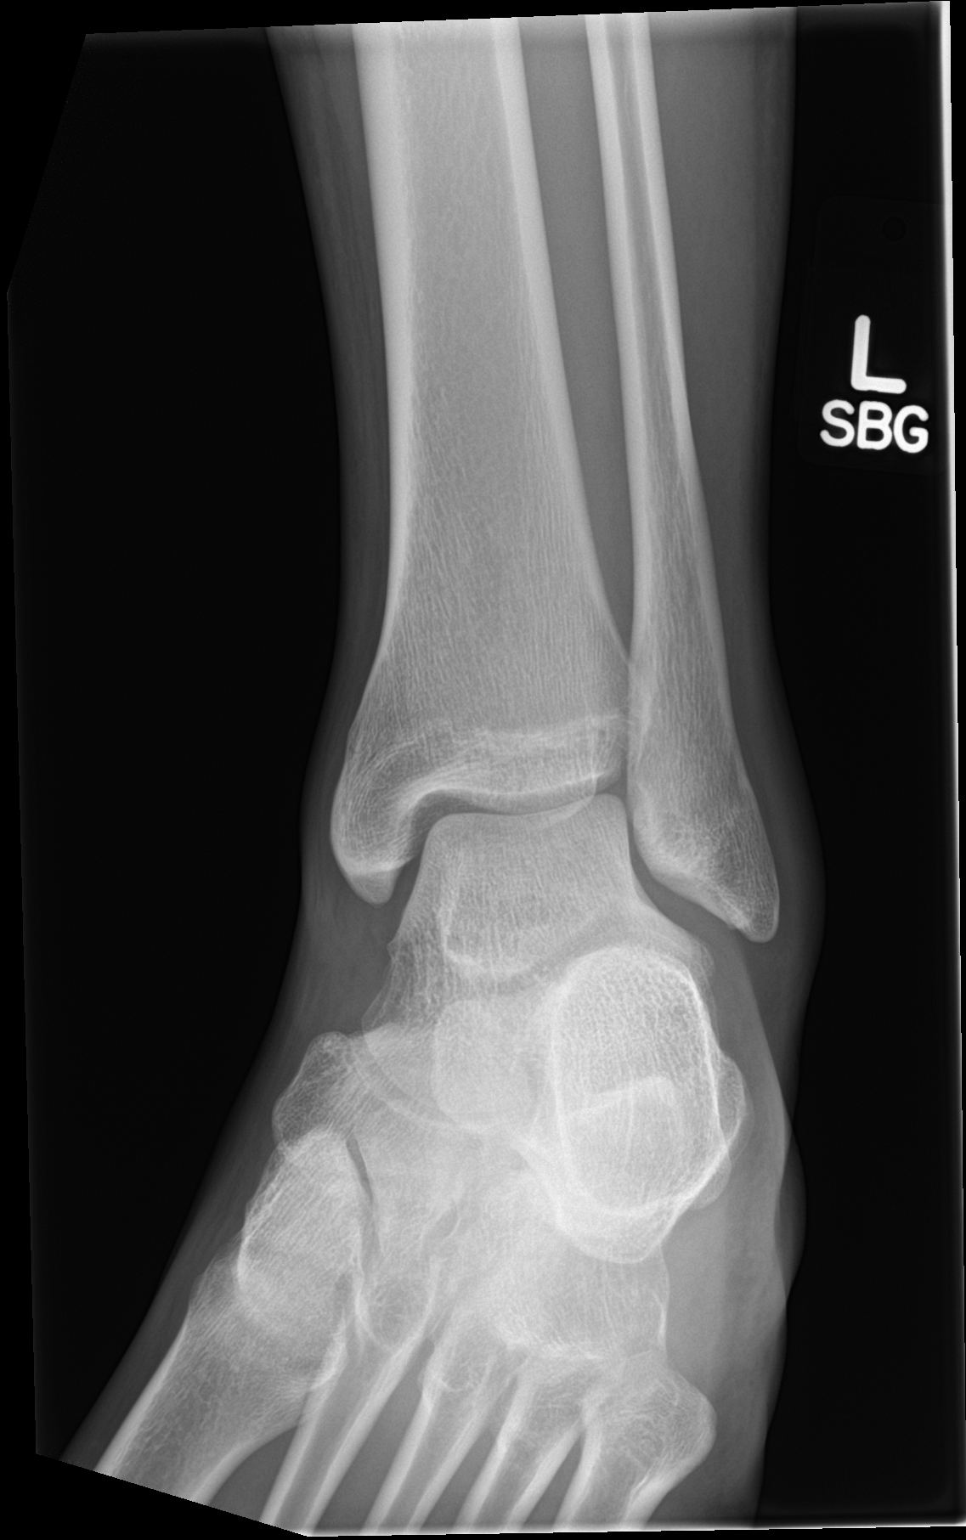

[ankle lat]
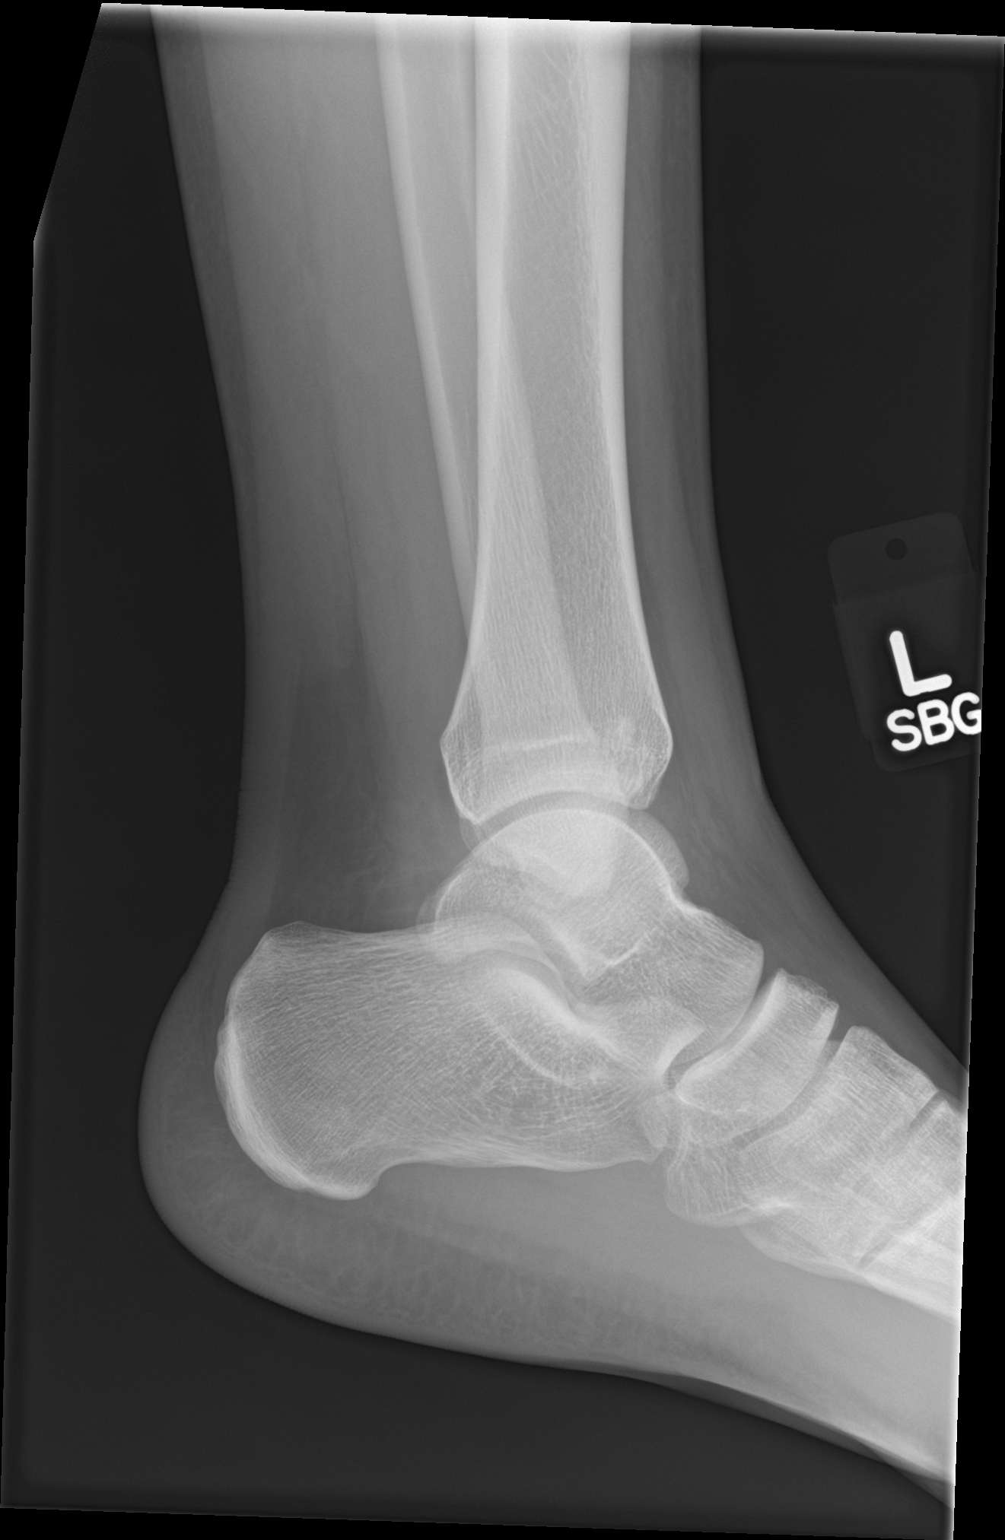

[3 of 3 positions shown; findings below may reference images not displayed]

FINDINGS: There is no evidence of fracture, dislocation, or joint effusion.
There is no evidence of arthropathy or other focal bone abnormality.
Mild lateral soft tissue edema.
IMPRESSION: Soft tissue edema without fracture or dislocation.

## 2019-05-13 NOTE — Progress Notes (Deleted)
PATIENT: Jonathan [name] DOB: [birth year]  REASON FOR VISIT: follow up HISTORY FROM: patient  HISTORY OF PRESENT ILLNESS: Today 05/13/19  Jonathan [name] is a 18 year old male with history of new onset seizure, initially in July 2020.  MRI of the brain and EEG was ordered after last office visit but was not completed.  He remained on Keppra since the initial seizure in July, and subsequent seizure in August.  He was in the ER 04/23/2019 with subsequent seizure, as he only been taking his morning dose of Keppra due to cost issues.  HISTORY 02/12/2019 Dr. Anne Hahn: Jonathan [name] is a 18 year old right-handed black male with a history of 2 recent seizures.  The patient had a seizure on 27 January 2019 while in bed early in the morning, the second seizure occurred on 07 February 2019 when he was awake, he was about ready to help his girlfriend move some furniture.  The patient was noted to suddenly blackout without warning, he had generalized jerking and bit his tongue.  The first seizure was associated with urinary incontinence.  The patient reports no family history of seizures and he denies any prior history of head trauma.  He did undergo a CT scan of the brain in the emergency room that was unremarkable, urine drug screen was positive for THC but otherwise unremarkable.  The patient reports no headaches, dizziness, numbness or weakness of extremities, or any balance changes.  He has never had a seizure event prior to 21 July.  He is sent to this office for an evaluation.  He has been placed on Keppra and he is tolerating the medication well.  He works as a Conservation officer, nature at [employer].   REVIEW OF SYSTEMS: Out of a complete 14 system review of symptoms, the patient complains only of the following symptoms, and all other reviewed systems are negative.  ALLERGIES: Allergies  Allergen Reactions  . Propofol Other (See Comments)    Arrhythmias resulting in hospitalization     HOME MEDICATIONS: Outpatient  Medications Prior to Visit  Medication Sig Dispense Refill  . acetaminophen (TYLENOL) 325 MG tablet Take 2 tablets (650 mg total) by mouth every 6 (six) hours as needed. (Patient not taking: Reported on 04/23/2019) 56 tablet 0  . ibuprofen (ADVIL,MOTRIN) 600 MG tablet Take 1 tablet (600 mg total) by mouth every 6 (six) hours as needed. (Patient not taking: Reported on 04/23/2019) 30 tablet 0  . levETIRAcetam (KEPPRA) 500 MG tablet Take 1 tablet (500 mg total) by mouth 2 (two) times daily. 60 tablet 1   No facility-administered medications prior to visit.     PAST MEDICAL HISTORY: Past Medical History:  Diagnosis Date  . Medical history non-contributory   . Seizures (HCC)     PAST SURGICAL HISTORY: Past Surgical History:  Procedure Laterality Date  . HERNIA REPAIR    . MOLE REMOVAL      FAMILY HISTORY: Family History  Problem Relation Age of Onset  . Heart disease Paternal Grandmother        fluid around heart    SOCIAL HISTORY: Social History   Socioeconomic History  . Marital status: Single    Spouse name: Not on file  . Number of children: Not on file  . Years of education: Not on file  . Highest education level: Not on file  Occupational History  . Not on file  Social Needs  . Financial resource strain: Not on file  . Food insecurity    Worry:  Not on file    Inability: Not on file  . Transportation needs    Medical: Not on file    Non-medical: Not on file  Tobacco Use  . Smoking status: Light Tobacco Smoker    Types: Cigars  . Smokeless tobacco: Never Used  . Tobacco comment: black and mild, 1 daily  Substance and Sexual Activity  . Alcohol use: No  . Drug use: Yes    Types: Marijuana    Comment: last used on 02/07/19  . Sexual activity: Yes    Birth control/protection: Condom  Lifestyle  . Physical activity    Days per week: Not on file    Minutes per session: Not on file  . Stress: Not on file  Relationships  . Social Herbalist on  phone: Not on file    Gets together: Not on file    Attends religious service: Not on file    Active member of club or organization: Not on file    Attends meetings of clubs or organizations: Not on file    Relationship status: Not on file  . Intimate partner violence    Fear of current or ex partner: Not on file    Emotionally abused: Not on file    Physically abused: Not on file    Forced sexual activity: Not on file  Other Topics Concern  . Not on file  Social History Narrative   Pt lives at home with mother and dog. Pt plays basketball.      PHYSICAL EXAM  There were no vitals filed for this visit. There is no height or weight on file to calculate BMI.  Generalized: Well developed, in no acute distress   Neurological examination  Mentation: Alert oriented to time, place, history taking. Follows all commands speech and language fluent Cranial nerve II-XII: Pupils were equal round reactive to light. Extraocular movements were full, visual field were full on confrontational test. Facial sensation and strength were normal. Uvula tongue midline. Head turning and shoulder shrug  were normal and symmetric. Motor: The motor testing reveals 5 over 5 strength of all 4 extremities. Good symmetric motor tone is noted throughout.  Sensory: Sensory testing is intact to soft touch on all 4 extremities. No evidence of extinction is noted.  Coordination: Cerebellar testing reveals good finger-nose-finger and heel-to-shin bilaterally.  Gait and station: Gait is normal. Tandem gait is normal. Romberg is negative. No drift is seen.  Reflexes: Deep tendon reflexes are symmetric and normal bilaterally.   DIAGNOSTIC DATA (LABS, IMAGING, TESTING) - I reviewed patient records, labs, notes, testing and imaging myself where available.  Lab Results  Component Value Date   WBC 9.2 04/23/2019   HGB 13.5 04/23/2019   HCT 41.8 04/23/2019   MCV 91.9 04/23/2019   PLT 271 04/23/2019      Component  Value Date/Time   NA 138 04/23/2019 2102   K 3.5 04/23/2019 2102   CL 101 04/23/2019 2102   CO2 21 (L) 04/23/2019 2102   GLUCOSE 94 04/23/2019 2102   BUN 15 04/23/2019 2102   CREATININE 1.18 04/23/2019 2102   CALCIUM 9.7 04/23/2019 2102   PROT 6.8 01/27/2019 0808   ALBUMIN 4.2 01/27/2019 0808   AST 19 01/27/2019 0808   ALT 13 01/27/2019 0808   ALKPHOS 59 01/27/2019 0808   BILITOT 1.3 (H) 01/27/2019 0808   GFRNONAA >60 04/23/2019 2102   GFRAA >60 04/23/2019 2102   No results found for: CHOL, HDL, LDLCALC,  LDLDIRECT, TRIG, CHOLHDL No results found for: ZOXW9UHGBA1C No results found for: VITAMINB12 No results found for: TSH    ASSESSMENT AND PLAN 18 y.o. year old male  has a past medical history of Medical history non-contributory and Seizures (HCC). here with ***   I spent 15 minutes with the patient. 50% of this time was spent   Margie EgeSarah Elizabet Schweppe, [place]olsom[place]NP-C, DNP 05/13/2019, 8:23 PM [facility] [address], [address] [place], [place] [address] [phone]

## 2019-05-14 ENCOUNTER — Ambulatory Visit: Payer: Self-pay | Admitting: Neurology

## 2019-05-14 ENCOUNTER — Telehealth: Payer: Self-pay

## 2019-05-14 NOTE — Telephone Encounter (Signed)
Patient was a no call/no show for their appointment today.   

## 2019-06-17 ENCOUNTER — Ambulatory Visit: Payer: Self-pay | Admitting: Neurology

## 2019-08-10 ENCOUNTER — Other Ambulatory Visit: Payer: Self-pay

## 2019-08-10 ENCOUNTER — Emergency Department (HOSPITAL_COMMUNITY)
Admission: EM | Admit: 2019-08-10 | Discharge: 2019-08-11 | Disposition: A | Discharge status: Home / Self Care | Payer: Medicaid Other | Attending: Emergency Medicine | Admitting: Emergency Medicine

## 2019-08-10 DIAGNOSIS — R569 Unspecified convulsions: Secondary | ICD-10-CM

## 2019-08-10 DIAGNOSIS — F17210 Nicotine dependence, cigarettes, uncomplicated: Secondary | ICD-10-CM | POA: Insufficient documentation

## 2019-08-10 LAB — CBG MONITORING, ED: Glucose-Capillary: 105 mg/dL — ABNORMAL HIGH (ref 70–99)

## 2019-08-10 MED ORDER — LEVETIRACETAM IN NACL 1000 MG/100ML IV SOLN
1000.0000 mg | Freq: Once | INTRAVENOUS | Status: AC
  Administered 2019-08-11: 1000 mg via INTRAVENOUS
  Filled 2019-08-10: qty 100

## 2019-08-10 NOTE — ED Triage Notes (Signed)
Pt BIB EMS and presents after having 4 grand mal seizures.  Pt's last seizure was upon arrival to the ED.  EMS gave pt 2.5mg  of Versed IV.  Per EMS, pt did bite his tongue.  Pt currently post-ictal and on a non-rebreather to prevent respiratory compromise.  Pt ran out of his Keppra about a week ago.

## 2019-08-10 NOTE — ED Provider Notes (Signed)
[facility]-EMERGENCY DEPT Provider Note   CSN: [id number] Arrival date & time: 08/10/19  2332     History Chief Complaint  Patient presents with  . Seizures    Jonathan [name] is a 19 y.o. male.  The history is provided by the patient and medical records.    19 y.o. M with history of seizures, presenting to the ED following seizure x4 PTA, last seizures when arriving to ED.  He was given 2.5mg  IV versed by EMS.  Patient is somnolent and post ictal upon arrival to ED. Patient did bite his tongue and had urinary incontinence.  CBG 105.  Patient reportedly ran out of his keppra about a week ago.  Past Medical History:  Diagnosis Date  . Medical history non-contributory   . Seizures [facility])     Patient Active Problem List   Diagnosis Date Noted  . Seizure disorder (HCC) 02/11/2019  . Arrhythmia 05/23/2015    Past Surgical History:  Procedure Laterality Date  . HERNIA REPAIR    . MOLE REMOVAL         Family History  Problem Relation Age of Onset  . Heart disease Paternal Grandmother        fluid around heart    Social History   Tobacco Use  . Smoking status: Light Tobacco Smoker    Types: Cigars  . Smokeless tobacco: Never Used  . Tobacco comment: black and mild, 1 daily  Substance Use Topics  . Alcohol use: No  . Drug use: Yes    Types: Marijuana    Comment: last used on 02/07/19    Home Medications Prior to Admission medications   Medication Sig Start Date End Date Taking? Authorizing Provider  acetaminophen (TYLENOL) 325 MG tablet Take 2 tablets (650 mg total) by mouth every 6 (six) hours as needed. Patient not taking: Reported on 04/23/2019 12/08/17   Aviva Kluver B, PA-C  ibuprofen (ADVIL,MOTRIN) 600 MG tablet Take 1 tablet (600 mg total) by mouth every 6 (six) hours as needed. Patient not taking: Reported on 04/23/2019 12/08/17   Aviva Kluver B, PA-C  levETIRAcetam (KEPPRA) 500 MG tablet Take 1 tablet (500 mg total) by mouth 2  (two) times daily. 04/23/19 06/22/19  Pricilla Loveless, MD    Allergies    Propofol  Review of Systems   Review of Systems  Neurological: Positive for seizures.  All other systems reviewed and are negative.   Physical Exam Updated Vital Signs BP 137/69 (BP Location: Left Arm)   Pulse (!) 114   Temp 97.6 F (36.4 C) (Oral)   Resp (!) 29   SpO2 100%   Physical Exam Vitals and nursing note reviewed.  Constitutional:      Appearance: He is well-developed.     Comments: Snoring respirations, intermittently moving around and changing position in bed  HENT:     Head: Normocephalic and atraumatic.     Comments: No visible head trauma    Mouth/Throat:     Comments: Abrasion to distal tongue, no active bleeding Eyes:     Conjunctiva/sclera: Conjunctivae normal.     Pupils: Pupils are equal, round, and reactive to light.  Cardiovascular:     Rate and Rhythm: Normal rate and regular rhythm.     Heart sounds: Normal heart sounds.  Pulmonary:     Effort: Pulmonary effort is normal.     Breath sounds: Normal breath sounds.  Abdominal:     General: Bowel sounds are normal.  Palpations: Abdomen is soft.  Genitourinary:    Comments: Pants and underwear soaked in urine from incontinence Musculoskeletal:        General: Normal range of motion.     Cervical back: Normal range of motion.  Skin:    General: Skin is warm and dry.  Neurological:     Mental Status: He is alert and oriented to person, place, and time.     ED Results / Procedures / Treatments   Labs (all labs ordered are listed, but only abnormal results are displayed) Labs Reviewed  CBC WITH DIFFERENTIAL/PLATELET - Abnormal; Notable for the following components:      Result Value   WBC 12.7 (*)    RDW 11.2 (*)    Neutro Abs 10.1 (*)    Abs Immature Granulocytes 0.10 (*)    All other components within normal limits  BASIC METABOLIC PANEL - Abnormal; Notable for the following components:   CO2 18 (*)     Glucose, Bld 128 (*)    Creatinine, Ser 1.29 (*)    Anion gap 16 (*)    All other components within normal limits  CBG MONITORING, ED - Abnormal; Notable for the following components:   Glucose-Capillary 105 (*)    All other components within normal limits  URINALYSIS, ROUTINE W REFLEX MICROSCOPIC    EKG EKG Interpretation  Date/Time:  Monday August 10 2019 23:55:59 EST Ventricular Rate:  106 PR Interval:    QRS Duration: 111 QT Interval:  357 QTC Calculation: 475 R Axis:   61 Text Interpretation: Sinus tachycardia LAE, consider biatrial enlargement RSR' in V1 or V2, probably normal variant Borderline T wave abnormalities Borderline prolonged QT interval Confirmed by Quintella Reichert [phone]) on 08/11/2019 12:00:50 AM   Radiology No results found.  Procedures Procedures (including critical care time)  Medications Ordered in ED Medications  levETIRAcetam (KEPPRA) IVPB 1000 mg/100 mL premix ( Intravenous Stopped 08/11/19 0031)    ED Course  I have reviewed the triage vital signs and the nursing notes.  Pertinent labs & imaging results that were available during my care of the patient were reviewed by me and considered in my medical decision making (see chart for details).    MDM Rules/Calculators/A&P  19 year old male here after seizure at home x4.  He reportedly ran out of his Keppra about a week ago.  Last seizure with EMS and was given 2.5 mg Versed with cessation of seizure.  Patient is postictal on arrival with snoring respirations.  Vitals remained stable.  He does have abrasion of distal tongue and has had obvious urinary incontinence, exam is otherwise atraumatic without any visible signs of head trauma.  Will plan for screening labs.  He was given 1 g IV Keppra load.  Labs reassuring.  Patient is intermittently moving around and changing position in bed, still very drowsy after Versed.  We will continue to monitor.  3:39 AM Patient AAOx3 at this time, able to answer  questions and follow commands.  He denies any pain or other complaints currently.  States he missed his last follow-up with neurology and therefore did not get keppra refilled.  He has been out of meds for about 1 week now.  He is feeling better at this time.  No further seizure activity after 4 hours of monitoring here in the ED.  He appears stable for discharge.  I have recommended close follow-up with neurology, refilled his keppra for 1 month to allow time to arrange this.  He  will return here for any new/acute changes.  Final Clinical Impression(s) / ED Diagnoses Final diagnoses:  Seizure [facility])    Rx / DC Orders ED Discharge Orders         Ordered    levETIRAcetam (KEPPRA) 500 MG tablet  2 times daily     08/11/19 0340           Garlon Hatchet, PA-C 08/11/19 2919    Shon Baton, MD 08/11/19 [phone]

## 2019-08-11 ENCOUNTER — Other Ambulatory Visit: Payer: Self-pay

## 2019-08-11 ENCOUNTER — Encounter (HOSPITAL_COMMUNITY): Payer: Self-pay | Admitting: *Deleted

## 2019-08-11 LAB — CBC WITH DIFFERENTIAL/PLATELET
Abs Immature Granulocytes: 0.1 10*3/uL — ABNORMAL HIGH (ref 0.00–0.07)
Basophils Absolute: 0 10*3/uL (ref 0.0–0.1)
Basophils Relative: 0 %
Eosinophils Absolute: 0 10*3/uL (ref 0.0–0.5)
Eosinophils Relative: 0 %
HCT: 47.3 % (ref 39.0–52.0)
Hemoglobin: 14.9 g/dL (ref 13.0–17.0)
Immature Granulocytes: 1 %
Lymphocytes Relative: 13 %
Lymphs Abs: 1.7 10*3/uL (ref 0.7–4.0)
MCH: 30.2 pg (ref 26.0–34.0)
MCHC: 31.5 g/dL (ref 30.0–36.0)
MCV: 95.9 fL (ref 80.0–100.0)
Monocytes Absolute: 0.7 10*3/uL (ref 0.1–1.0)
Monocytes Relative: 6 %
Neutro Abs: 10.1 10*3/uL — ABNORMAL HIGH (ref 1.7–7.7)
Neutrophils Relative %: 80 %
Platelets: 262 10*3/uL (ref 150–400)
RBC: 4.93 MIL/uL (ref 4.22–5.81)
RDW: 11.2 % — ABNORMAL LOW (ref 11.5–15.5)
WBC: 12.7 10*3/uL — ABNORMAL HIGH (ref 4.0–10.5)
nRBC: 0 % (ref 0.0–0.2)

## 2019-08-11 LAB — BASIC METABOLIC PANEL
Anion gap: 16 — ABNORMAL HIGH (ref 5–15)
BUN: 11 mg/dL (ref 6–20)
CO2: 18 mmol/L — ABNORMAL LOW (ref 22–32)
Calcium: 9.3 mg/dL (ref 8.9–10.3)
Chloride: 103 mmol/L (ref 98–111)
Creatinine, Ser: 1.29 mg/dL — ABNORMAL HIGH (ref 0.61–1.24)
GFR calc Af Amer: >60 mL/min (ref >60)
GFR calc non Af Amer: >60 mL/min (ref >60)
Glucose, Bld: 128 mg/dL — ABNORMAL HIGH (ref 70–99)
Potassium: 3.9 mmol/L (ref 3.5–5.1)
Sodium: 137 mmol/L (ref 135–145)

## 2019-08-11 MED ORDER — LEVETIRACETAM 500 MG PO TABS: 500.0000 mg | ORAL_TABLET | Freq: Two times a day (BID) | ORAL | 0 refills | Status: DC

## 2019-08-11 NOTE — Discharge Instructions (Addendum)
Re-start your keppra, make sure to take as directed and try not to miss any doses. Schedule follow-up with your neurologist. Return here for any new/acute changes.

## 2019-08-20 ENCOUNTER — Encounter: Payer: Medicaid Other | Admitting: Internal Medicine

## 2019-08-27 ENCOUNTER — Encounter: Payer: Medicaid Other | Admitting: Internal Medicine

## 2019-09-07 ENCOUNTER — Telehealth: Payer: Self-pay | Admitting: Internal Medicine

## 2019-09-07 ENCOUNTER — Encounter: Payer: Medicaid Other | Admitting: Internal Medicine

## 2019-09-07 ENCOUNTER — Telehealth: Payer: Self-pay

## 2019-09-07 ENCOUNTER — Encounter: Payer: Self-pay | Admitting: Internal Medicine

## 2019-09-07 NOTE — Telephone Encounter (Signed)
Pt had an office visit scheduled with Dr. Dortha Schwalbe for today at 3:45, he did not show up and TC was placed to patient by Dr. Dortha Schwalbe.  Pt requested to r/s appt and will call clinic back pr Dr. Dortha Schwalbe. SChaplin, RN,BSN

## 2019-09-07 NOTE — Telephone Encounter (Signed)
I was able to reach out to [place] and he made me aware that his mom did not get off work early and could not bring him to his appointment today. He tells me that he will re-schedule his appt. I advised him that I have an opening on 3/15 and he states that he will reach out to his mom first.

## 2019-11-11 ENCOUNTER — Emergency Department (HOSPITAL_COMMUNITY)
Admission: EM | Admit: 2019-11-11 | Discharge: 2019-11-11 | Disposition: A | Discharge status: Home / Self Care | Payer: Medicaid Other | Attending: Emergency Medicine | Admitting: Emergency Medicine

## 2019-11-11 DIAGNOSIS — F1210 Cannabis abuse, uncomplicated: Secondary | ICD-10-CM | POA: Insufficient documentation

## 2019-11-11 DIAGNOSIS — R569 Unspecified convulsions: Secondary | ICD-10-CM | POA: Insufficient documentation

## 2019-11-11 DIAGNOSIS — F17290 Nicotine dependence, other tobacco product, uncomplicated: Secondary | ICD-10-CM | POA: Insufficient documentation

## 2019-11-11 LAB — CBC
HCT: 47.8 % (ref 39.0–52.0)
Hemoglobin: 15 g/dL (ref 13.0–17.0)
MCH: 29.9 pg (ref 26.0–34.0)
MCHC: 31.4 g/dL (ref 30.0–36.0)
MCV: 95.4 fL (ref 80.0–100.0)
Platelets: 262 10*3/uL (ref 150–400)
RBC: 5.01 MIL/uL (ref 4.22–5.81)
RDW: 11 % — ABNORMAL LOW (ref 11.5–15.5)
WBC: 16.2 10*3/uL — ABNORMAL HIGH (ref 4.0–10.5)
nRBC: 0 % (ref 0.0–0.2)

## 2019-11-11 LAB — BASIC METABOLIC PANEL
Anion gap: 14 (ref 5–15)
BUN: 11 mg/dL (ref 6–20)
CO2: 19 mmol/L — ABNORMAL LOW (ref 22–32)
Calcium: 9.4 mg/dL (ref 8.9–10.3)
Chloride: 105 mmol/L (ref 98–111)
Creatinine, Ser: 1.19 mg/dL (ref 0.61–1.24)
GFR calc Af Amer: >60 mL/min (ref >60)
GFR calc non Af Amer: >60 mL/min (ref >60)
Glucose, Bld: 124 mg/dL — ABNORMAL HIGH (ref 70–99)
Potassium: 4.3 mmol/L (ref 3.5–5.1)
Sodium: 138 mmol/L (ref 135–145)

## 2019-11-11 LAB — CBG MONITORING, ED: Glucose-Capillary: 116 mg/dL — ABNORMAL HIGH (ref 70–99)

## 2019-11-11 MED ORDER — LEVETIRACETAM 500 MG PO TABS
1000.0000 mg | ORAL_TABLET | Freq: Once | ORAL | Status: AC
  Administered 2019-11-11: 1000 mg via ORAL
  Filled 2019-11-11: qty 2

## 2019-11-11 NOTE — ED Notes (Signed)
Patient verbalizes understanding of discharge instructions. Opportunity for questioning and answers were provided. Armband removed by staff, pt discharged from ED and ambulated to lobby to car to return home.

## 2019-11-11 NOTE — ED Provider Notes (Incomplete)
?  [facility] EMERGENCY DEPARTMENT ?Provider Note ? ? ?CSN: [id number] ?Arrival date & time: 11/11/19  1148 ? ?  ? ?History ?Chief Complaint  ?Patient presents with  ?? Seizures  ? ? ?Jonathan [name] is a 19 y.o. male. ? ?HPI ? ?  ? ?Past Medical History:  ?Diagnosis Date  ?? Medical history non-contributory   ?? Seizures (HCC)   ? ? ?Patient Active Problem List  ? Diagnosis Date Noted  ?? Seizure disorder (HCC) 02/11/2019  ?? Arrhythmia 05/23/2015  ? ? ?Past Surgical History:  ?Procedure Laterality Date  ?? HERNIA REPAIR    ?? MOLE REMOVAL    ?  ? ? ? ?Family History  ?Problem Relation Age of Onset  ?? Heart disease Paternal Grandmother   ?     fluid around heart  ? ? ?Social History  ? ?Tobacco Use  ?? Smoking status: Light Tobacco Smoker  ?  Types: Cigars  ?? Smokeless tobacco: Never Used  ?? Tobacco comment: black and mild, 1 daily  ?Substance Use Topics  ?? Alcohol use: No  ?? Drug use: Yes  ?  Types: Marijuana  ?  Comment: last used on 02/07/19  ? ? ?Home Medications ?Prior to Admission medications   ?Medication Sig Start Date End Date Taking? Authorizing Provider  ?acetaminophen (TYLENOL) 325 MG tablet Take 2 tablets (650 mg total) by mouth every 6 (six) hours as needed. ?Patient not taking: Reported on 04/23/2019 12/08/17   Aviva Kluver B, PA-C  ?ibuprofen (ADVIL,MOTRIN) 600 MG tablet Take 1 tablet (600 mg total) by mouth every 6 (six) hours as needed. ?Patient not taking: Reported on 04/23/2019 12/08/17   Aviva Kluver B, PA-C  ?levETIRAcetam (KEPPRA) 500 MG tablet Take 1 tablet (500 mg total) by mouth 2 (two) times daily. 08/11/19   Garlon Hatchet, PA-C  ? ? ?Allergies    ?Propofol ? ?Review of Systems   ?Review of Systems ? ?Physical Exam ?Updated Vital Signs ?BP 122/67 (BP Location: Left Arm)   Pulse 85   Temp 98.1 ?F (36.7 ?C) (Oral)   Resp 14   SpO2 100%  ? ?Physical Exam ? ?ED Results / Procedures / Treatments   ?Labs ? (all labs ordered are listed, but only abnormal results are displayed) ?Labs Reviewed  ?BASIC METABOLIC PANEL - Abnormal;

## 2019-11-11 NOTE — Discharge Instructions (Signed)
You are seen today because she had a seizure.  I think he had a seizure because she missed her morning dose of your medication.  You were given some Keppra here in the emergency department.  It is very important that you follow-up with your neurologist as you are scheduled.  Please take your medication twice a day every day without missing a dose. Thank you for allowing me to care for you today. Please return to the emergency department if you have new or worsening symptoms. Take your medications as instructed.

## 2019-11-11 NOTE — ED Provider Notes (Signed)
[facility] EMERGENCY DEPARTMENT Provider Note   CSN: [id number] Arrival date & time: 11/11/19  1148     History Chief Complaint  Patient presents with  . Seizures    Jonathan [name] is a 19 y.o. male.  Patient is a 19 year old male with past medical history of seizure disorder on Keppra presenting to the emergency department for seizure.  Patient reportedly had 2 witnessed seizures this morning while in bed lasting a total of 5 minutes.  Patient currently at baseline and no longer postictal.  He is supposed be taking Keppra 500 mg twice daily.  He has a history of noncompliance with his medication and multiple no-shows at his neurology appointments.  He reports that his last dose of Keppra was sometime yesterday afternoon.  He has not had his morning dose of Keppra today.  Denies any fever, chills, chest pain, shortness of breath or other symptoms.  He did not have any injuries during the seizure and remained in bed for the entirety of the seizures        Past Medical History:  Diagnosis Date  . Medical history non-contributory   . Seizures [facility])     Patient Active Problem List   Diagnosis Date Noted  . Seizure disorder (HCC) 02/11/2019  . Arrhythmia 05/23/2015    Past Surgical History:  Procedure Laterality Date  . HERNIA REPAIR    . MOLE REMOVAL         Family History  Problem Relation Age of Onset  . Heart disease Paternal Grandmother        fluid around heart    Social History   Tobacco Use  . Smoking status: Light Tobacco Smoker    Types: Cigars  . Smokeless tobacco: Never Used  . Tobacco comment: black and mild, 1 daily  Substance Use Topics  . Alcohol use: No  . Drug use: Yes    Types: Marijuana    Comment: last used on 02/07/19    Home Medications Prior to Admission medications   Medication Sig Start Date End Date Taking? Authorizing Provider  acetaminophen (TYLENOL) 325 MG tablet Take 2 tablets (650 mg total) by mouth every  6 (six) hours as needed. Patient not taking: Reported on 04/23/2019 12/08/17   Aviva Kluver B, PA-C  ibuprofen (ADVIL,MOTRIN) 600 MG tablet Take 1 tablet (600 mg total) by mouth every 6 (six) hours as needed. Patient not taking: Reported on 04/23/2019 12/08/17   Aviva Kluver B, PA-C  levETIRAcetam (KEPPRA) 500 MG tablet Take 1 tablet (500 mg total) by mouth 2 (two) times daily. 08/11/19   Garlon Hatchet, PA-C    Allergies    Propofol  Review of Systems   Review of Systems  Constitutional: Negative for chills and fever.  HENT: Negative for ear pain and sore throat.   Eyes: Negative for pain and visual disturbance.  Respiratory: Negative for cough and shortness of breath.   Cardiovascular: Negative for chest pain and palpitations.  Gastrointestinal: Negative for abdominal pain and vomiting.  Genitourinary: Negative for dysuria and hematuria.  Musculoskeletal: Negative for arthralgias and back pain.  Skin: Negative for color change and rash.  Neurological: Positive for seizures.  All other systems reviewed and are negative.   Physical Exam Updated Vital Signs BP 122/67 (BP Location: Left Arm)   Pulse 85   Temp 98.1 F (36.7 C) (Oral)   Resp 14   SpO2 100%   Physical Exam Vitals and nursing note reviewed.  Constitutional:  General: He is not in acute distress.    Appearance: Normal appearance. He is not ill-appearing, toxic-appearing or diaphoretic.  HENT:     Head: Normocephalic.     Mouth/Throat:     Mouth: Mucous membranes are moist.  Eyes:     Conjunctiva/sclera: Conjunctivae normal.  Cardiovascular:     Rate and Rhythm: Regular rhythm.  Pulmonary:     Effort: Pulmonary effort is normal.  Skin:    General: Skin is dry.  Neurological:     General: No focal deficit present.     Mental Status: He is alert and oriented to person, place, and time.  Psychiatric:        Mood and Affect: Mood normal.     ED Results / Procedures / Treatments   Labs (all labs  ordered are listed, but only abnormal results are displayed) Labs Reviewed  BASIC METABOLIC PANEL - Abnormal; Notable for the following components:      Result Value   CO2 19 (*)    Glucose, Bld 124 (*)    All other components within normal limits  CBC - Abnormal; Notable for the following components:   WBC 16.2 (*)    RDW 11.0 (*)    All other components within normal limits  CBG MONITORING, ED - Abnormal; Notable for the following components:   Glucose-Capillary 116 (*)    All other components within normal limits    EKG None  Radiology No results found.  Procedures Procedures (including critical care time)  Medications Ordered in ED Medications  levETIRAcetam (KEPPRA) tablet 1,000 mg (1,000 mg Oral Given 11/11/19 1458)    ED Course  I have reviewed the triage vital signs and the nursing notes.  Pertinent labs & imaging results that were available during my care of the patient were reviewed by me and considered in my medical decision making (see chart for details).  Clinical Course as of Nov 11 1519  Wed Nov 11, 2019  1448 Patient with known history of seizure disorder had seizure this afternoon after missing morning dose Keppra.  Currently at baseline.  Labs reassuring.   [KM]  4332 Patient given a dose of keppra here in ED. Advised on importance in taking meds exactly as prescribed without missing doses. Advised f/u neurology.    [KM]    Clinical Course User Index [KM] [name]   MDM Rules/Calculators/A&P                      Based on review of vitals, medical screening exam, lab work and/or imaging, there does not appear to be an acute, emergent etiology for the patient's symptoms. Counseled pt on good return precautions and encouraged both PCP and ED follow-up as needed.  Prior to discharge, I also discussed incidental imaging findings with patient in detail and advised appropriate, recommended follow-up in detail.  Clinical Impression: 1. Seizure  [facility])     Disposition: Discharge  Prior to providing a prescription for a controlled substance, I independently reviewed the patient's recent prescription history on the [place]. The patient had no recent or regular prescriptions and was deemed appropriate for a brief, less than 3 day prescription of narcotic for acute analgesia.  This note was prepared with assistance of Systems analyst. Occasional wrong-word or sound-a-like substitutions may have occurred due to the inherent limitations of voice recognition software.  Final Clinical Impression(s) / ED Diagnoses Final diagnoses:  Seizure ([place])  Rx / DC Orders ED Discharge Orders    None       Jeral Pinch 11/11/19 1521    Terald Sleeper, MD 11/11/19 [phone]

## 2019-11-11 NOTE — ED Triage Notes (Signed)
Pt bib ems with reports of 2 witnessed seizures today lasting approx 1-2 minutes. Hx of seizures. Non compliant with keppra.

## 2019-11-17 ENCOUNTER — Telehealth: Payer: Self-pay

## 2019-11-17 NOTE — Telephone Encounter (Signed)
If pt calls back they can see Maralyn Sago NP for Ed seizure concerns per Dr. Anne Hahn. PT was last seen 02/2019 in our office. VM was left for pt to call back to schedule office visit for the ED visit.

## 2019-11-17 NOTE — Telephone Encounter (Addendum)
Pt mother called back and requested a CB from RN to discuss OV and [facility] financial assistance

## 2019-11-17 NOTE — Telephone Encounter (Signed)
I called pts mom [name] that pt has been seen in the ED twice in the last 3 months for seizures. I stated Dr.Willis wants pt to be seen for a evaluation. The mom stated she is not working and does not have the money to do self pay or bring anything. Her son does not have insurance and she has been trying to get medicaid establish. She has a orange card for her son. I stated per Fredia Sorrow front desk staff our office does not accept orange cards.I gave her [facility] financial assistance to get help with medical appts. I gave her the number to call them and apply for her son to get assistance. The mom was appreciate and will call them about financial help.I advise her when she applies and if he gets approve to call our office for an appt with NP or Dr. Anne Hahn. She verbalized understanding.

## 2019-11-17 NOTE — Telephone Encounter (Signed)
I called pts mom back and she call the financial assistance for [place]. She had a rep on the phone and got disconnected. I advise the mom that department would be able to assist her and answer all of her questions. I verified the number with her again.She verbalized understanding.Marland Kitchen

## 2020-01-04 ENCOUNTER — Encounter: Payer: Medicaid Other | Admitting: Internal Medicine

## 2020-01-04 ENCOUNTER — Ambulatory Visit: Payer: Medicaid Other

## 2020-03-13 ENCOUNTER — Other Ambulatory Visit: Payer: Self-pay

## 2020-03-13 ENCOUNTER — Emergency Department (HOSPITAL_COMMUNITY)
Admission: EM | Admit: 2020-03-13 | Discharge: 2020-03-13 | Discharge status: Against Medical Advice | Payer: Medicaid Other

## 2020-03-13 NOTE — ED Triage Notes (Signed)
Arrives awake and alert per [facility]. Upon arrival he abruptly announces that "I want to leave". He then instructs the paramedic to remove his IV, which they do. He refuses any v.s/assessment(s) and proceeds to capably ambulate from our department.

## 2020-08-08 ENCOUNTER — Ambulatory Visit (HOSPITAL_COMMUNITY): Admit: 2020-08-08 | Discharge status: Against Medical Advice | Payer: Medicaid Other | Source: Home / Self Care

## 2020-10-19 ENCOUNTER — Other Ambulatory Visit: Payer: Self-pay

## 2020-10-19 ENCOUNTER — Emergency Department (HOSPITAL_COMMUNITY): Payer: Self-pay

## 2020-10-19 ENCOUNTER — Inpatient Hospital Stay (HOSPITAL_COMMUNITY)
Admission: EM | Admit: 2020-10-19 | Discharge: 2020-11-04 | DRG: 957 | Disposition: A | Discharge status: Home Health | Payer: Self-pay | Attending: Surgery | Admitting: Surgery

## 2020-10-19 ENCOUNTER — Encounter (HOSPITAL_COMMUNITY): Payer: Self-pay

## 2020-10-19 DIAGNOSIS — S3661XA Primary blast injury of rectum, initial encounter: Principal | ICD-10-CM | POA: Diagnosis present

## 2020-10-19 DIAGNOSIS — K623 Rectal prolapse: Secondary | ICD-10-CM | POA: Diagnosis present

## 2020-10-19 DIAGNOSIS — S3730XA Unspecified injury of urethra, initial encounter: Secondary | ICD-10-CM | POA: Diagnosis present

## 2020-10-19 DIAGNOSIS — Z79899 Other long term (current) drug therapy: Secondary | ICD-10-CM

## 2020-10-19 DIAGNOSIS — S32009A Unspecified fracture of unspecified lumbar vertebra, initial encounter for closed fracture: Secondary | ICD-10-CM

## 2020-10-19 DIAGNOSIS — Z0189 Encounter for other specified special examinations: Secondary | ICD-10-CM

## 2020-10-19 DIAGNOSIS — K9189 Other postprocedural complications and disorders of digestive system: Secondary | ICD-10-CM

## 2020-10-19 DIAGNOSIS — S31139A Puncture wound of abdominal wall without foreign body, unspecified quadrant without penetration into peritoneal cavity, initial encounter: Secondary | ICD-10-CM

## 2020-10-19 DIAGNOSIS — W3400XA Accidental discharge from unspecified firearms or gun, initial encounter: Secondary | ICD-10-CM

## 2020-10-19 DIAGNOSIS — G40909 Epilepsy, unspecified, not intractable, without status epilepticus: Secondary | ICD-10-CM | POA: Diagnosis present

## 2020-10-19 DIAGNOSIS — Z23 Encounter for immunization: Secondary | ICD-10-CM

## 2020-10-19 DIAGNOSIS — R531 Weakness: Secondary | ICD-10-CM | POA: Diagnosis present

## 2020-10-19 DIAGNOSIS — S36498A Other injury of other part of small intestine, initial encounter: Secondary | ICD-10-CM | POA: Diagnosis present

## 2020-10-19 DIAGNOSIS — Z884 Allergy status to anesthetic agent status: Secondary | ICD-10-CM

## 2020-10-19 DIAGNOSIS — S7402XA Injury of sciatic nerve at hip and thigh level, left leg, initial encounter: Secondary | ICD-10-CM | POA: Diagnosis present

## 2020-10-19 DIAGNOSIS — S32058B Other fracture of fifth lumbar vertebra, initial encounter for open fracture: Secondary | ICD-10-CM | POA: Diagnosis present

## 2020-10-19 DIAGNOSIS — K658 Other peritonitis: Secondary | ICD-10-CM | POA: Diagnosis present

## 2020-10-19 DIAGNOSIS — S3663XA Laceration of rectum, initial encounter: Secondary | ICD-10-CM | POA: Diagnosis present

## 2020-10-19 DIAGNOSIS — E876 Hypokalemia: Secondary | ICD-10-CM | POA: Diagnosis not present

## 2020-10-19 DIAGNOSIS — S31813A Puncture wound without foreign body of right buttock, initial encounter: Secondary | ICD-10-CM

## 2020-10-19 DIAGNOSIS — S3210XB Unspecified fracture of sacrum, initial encounter for open fracture: Secondary | ICD-10-CM | POA: Diagnosis present

## 2020-10-19 DIAGNOSIS — N179 Acute kidney failure, unspecified: Secondary | ICD-10-CM | POA: Diagnosis not present

## 2020-10-19 DIAGNOSIS — S21232A Puncture wound without foreign body of left back wall of thorax without penetration into thoracic cavity, initial encounter: Secondary | ICD-10-CM

## 2020-10-19 DIAGNOSIS — T1490XA Injury, unspecified, initial encounter: Secondary | ICD-10-CM

## 2020-10-19 DIAGNOSIS — D62 Acute posthemorrhagic anemia: Secondary | ICD-10-CM | POA: Diagnosis not present

## 2020-10-19 DIAGNOSIS — Z978 Presence of other specified devices: Secondary | ICD-10-CM

## 2020-10-19 DIAGNOSIS — F17290 Nicotine dependence, other tobacco product, uncomplicated: Secondary | ICD-10-CM | POA: Diagnosis present

## 2020-10-19 DIAGNOSIS — D696 Thrombocytopenia, unspecified: Secondary | ICD-10-CM | POA: Diagnosis not present

## 2020-10-19 DIAGNOSIS — Z9889 Other specified postprocedural states: Secondary | ICD-10-CM

## 2020-10-19 DIAGNOSIS — K567 Ileus, unspecified: Secondary | ICD-10-CM | POA: Diagnosis not present

## 2020-10-19 DIAGNOSIS — Z8249 Family history of ischemic heart disease and other diseases of the circulatory system: Secondary | ICD-10-CM

## 2020-10-19 DIAGNOSIS — S36892A Contusion of other intra-abdominal organs, initial encounter: Secondary | ICD-10-CM | POA: Diagnosis present

## 2020-10-19 DIAGNOSIS — S31644A Puncture wound with foreign body of abdominal wall, left lower quadrant with penetration into peritoneal cavity, initial encounter: Secondary | ICD-10-CM | POA: Diagnosis present

## 2020-10-19 DIAGNOSIS — S3729XA Other injury of bladder, initial encounter: Secondary | ICD-10-CM | POA: Diagnosis present

## 2020-10-19 DIAGNOSIS — Z20822 Contact with and (suspected) exposure to covid-19: Secondary | ICD-10-CM | POA: Diagnosis present

## 2020-10-19 DIAGNOSIS — Z4659 Encounter for fitting and adjustment of other gastrointestinal appliance and device: Secondary | ICD-10-CM

## 2020-10-19 DIAGNOSIS — S3720XA Unspecified injury of bladder, initial encounter: Secondary | ICD-10-CM

## 2020-10-19 LAB — COMPREHENSIVE METABOLIC PANEL
ALT: 16 U/L (ref 0–44)
AST: 24 U/L (ref 15–41)
Albumin: 4.7 g/dL (ref 3.5–5.0)
Alkaline Phosphatase: 51 U/L (ref 38–126)
Anion gap: 17 — ABNORMAL HIGH (ref 5–15)
BUN: 13 mg/dL (ref 6–20)
CO2: 21 mmol/L — ABNORMAL LOW (ref 22–32)
Calcium: 9.2 mg/dL (ref 8.9–10.3)
Chloride: 102 mmol/L (ref 98–111)
Creatinine, Ser: 1.56 mg/dL — ABNORMAL HIGH (ref 0.61–1.24)
GFR, Estimated: >60 mL/min (ref >60)
Glucose, Bld: 115 mg/dL — ABNORMAL HIGH (ref 70–99)
Potassium: 2.6 mmol/L — CL (ref 3.5–5.1)
Sodium: 140 mmol/L (ref 135–145)
Total Bilirubin: 1.9 mg/dL — ABNORMAL HIGH (ref 0.3–1.2)
Total Protein: 7.4 g/dL (ref 6.5–8.1)

## 2020-10-19 LAB — I-STAT CHEM 8, ED
BUN: 13 mg/dL (ref 6–20)
Calcium, Ion: 1.21 mmol/L (ref 1.15–1.40)
Chloride: 101 mmol/L (ref 98–111)
Creatinine, Ser: 1.6 mg/dL — ABNORMAL HIGH (ref 0.61–1.24)
Glucose, Bld: 109 mg/dL — ABNORMAL HIGH (ref 70–99)
HCT: 41 % (ref 39.0–52.0)
Hemoglobin: 13.9 g/dL (ref 13.0–17.0)
Potassium: 2.8 mmol/L — ABNORMAL LOW (ref 3.5–5.1)
Sodium: 141 mmol/L (ref 135–145)
TCO2: 24 mmol/L (ref 22–32)

## 2020-10-19 LAB — PROTIME-INR
INR: 1.2 (ref 0.8–1.2)
Prothrombin Time: 15.2 seconds (ref 11.4–15.2)

## 2020-10-19 LAB — CBC
HCT: 42.3 % (ref 39.0–52.0)
Hemoglobin: 13.1 g/dL (ref 13.0–17.0)
MCH: 30 pg (ref 26.0–34.0)
MCHC: 31 g/dL (ref 30.0–36.0)
MCV: 97 fL (ref 80.0–100.0)
Platelets: 264 10*3/uL (ref 150–400)
RBC: 4.36 MIL/uL (ref 4.22–5.81)
RDW: 11.2 % — ABNORMAL LOW (ref 11.5–15.5)
WBC: 8.5 10*3/uL (ref 4.0–10.5)
nRBC: 0 % (ref 0.0–0.2)

## 2020-10-19 LAB — SAMPLE TO BLOOD BANK

## 2020-10-19 LAB — LACTIC ACID, PLASMA: Lactic Acid, Venous: 6.5 mmol/L (ref 0.5–1.9)

## 2020-10-19 LAB — ETHANOL: Alcohol, Ethyl (B): <10 mg/dL (ref <10)

## 2020-10-19 MED ORDER — TETANUS-DIPHTH-ACELL PERTUSSIS 5-2.5-18.5 LF-MCG/0.5 IM SUSY
0.5000 mL | PREFILLED_SYRINGE | Freq: Once | INTRAMUSCULAR | Status: AC
  Administered 2020-10-19: 0.5 mL via INTRAMUSCULAR
  Filled 2020-10-19: qty 0.5

## 2020-10-19 MED ORDER — ONDANSETRON HCL 4 MG/2ML IJ SOLN
INTRAMUSCULAR | Status: AC
  Filled 2020-10-19: qty 2

## 2020-10-19 MED ORDER — LACTATED RINGERS IV BOLUS
1000.0000 mL | Freq: Once | INTRAVENOUS | Status: AC
  Administered 2020-10-19: 1000 mL via INTRAVENOUS

## 2020-10-19 MED ORDER — FENTANYL CITRATE (PF) 100 MCG/2ML IJ SOLN
50.0000 ug | Freq: Once | INTRAMUSCULAR | Status: AC
  Administered 2020-10-19: 50 ug via INTRAVENOUS

## 2020-10-19 MED ORDER — CEFAZOLIN SODIUM-DEXTROSE 1-4 GM/50ML-% IV SOLN
1.0000 g | Freq: Once | INTRAVENOUS | Status: AC
  Administered 2020-10-19: 1 g via INTRAVENOUS
  Filled 2020-10-19: qty 50

## 2020-10-19 MED ORDER — FENTANYL CITRATE (PF) 100 MCG/2ML IJ SOLN
50.0000 ug | Freq: Once | INTRAMUSCULAR | Status: AC
  Administered 2020-10-19: 50 ug via INTRAVENOUS
  Filled 2020-10-19: qty 2

## 2020-10-19 MED ORDER — FENTANYL CITRATE (PF) 100 MCG/2ML IJ SOLN
INTRAMUSCULAR | Status: AC
  Filled 2020-10-19: qty 2

## 2020-10-19 MED ORDER — IOHEXOL 300 MG/ML  SOLN
100.0000 mL | Freq: Once | INTRAMUSCULAR | Status: AC | PRN
  Administered 2020-10-19: 100 mL via INTRAVENOUS

## 2020-10-19 NOTE — ED Notes (Signed)
Patient alert and oriented at this time. Complaining of left leg pain.
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
## 2020-10-19 NOTE — ED Notes (Signed)
Critical Result  Potassium 2.6 @2349 

## 2020-10-20 ENCOUNTER — Encounter (HOSPITAL_COMMUNITY): Payer: Self-pay | Admitting: Certified Registered"

## 2020-10-20 ENCOUNTER — Emergency Department (HOSPITAL_COMMUNITY): Payer: Self-pay | Admitting: Certified Registered"

## 2020-10-20 ENCOUNTER — Encounter (HOSPITAL_COMMUNITY): Admission: EM | Disposition: A | Discharge status: Home Health | Payer: Self-pay | Source: Home / Self Care

## 2020-10-20 DIAGNOSIS — S21232A Puncture wound without foreign body of left back wall of thorax without penetration into thoracic cavity, initial encounter: Secondary | ICD-10-CM

## 2020-10-20 DIAGNOSIS — S31139A Puncture wound of abdominal wall without foreign body, unspecified quadrant without penetration into peritoneal cavity, initial encounter: Secondary | ICD-10-CM

## 2020-10-20 DIAGNOSIS — S3661XA Primary blast injury of rectum, initial encounter: Secondary | ICD-10-CM

## 2020-10-20 DIAGNOSIS — S3720XA Unspecified injury of bladder, initial encounter: Secondary | ICD-10-CM

## 2020-10-20 DIAGNOSIS — S31813A Puncture wound without foreign body of right buttock, initial encounter: Secondary | ICD-10-CM

## 2020-10-20 DIAGNOSIS — S32009A Unspecified fracture of unspecified lumbar vertebra, initial encounter for closed fracture: Secondary | ICD-10-CM

## 2020-10-20 HISTORY — PX: REPAIR OF RECTAL PROLAPSE: SHX6420

## 2020-10-20 HISTORY — PX: CYSTOSCOPY W/ RETROGRADES: SHX1426

## 2020-10-20 HISTORY — PX: BLADDER REPAIR: SHX6721

## 2020-10-20 HISTORY — PX: LAPAROTOMY: SHX154

## 2020-10-20 HISTORY — PX: COLOSTOMY: SHX63

## 2020-10-20 LAB — RESP PANEL BY RT-PCR (FLU A&B, COVID) ARPGX2
Influenza A by PCR: NEGATIVE
Influenza B by PCR: NEGATIVE
SARS Coronavirus 2 by RT PCR: NEGATIVE

## 2020-10-20 LAB — POCT I-STAT 7, (LYTES, BLD GAS, ICA,H+H)
Acid-base deficit: 4 mmol/L — ABNORMAL HIGH (ref 0.0–2.0)
Acid-base deficit: 8 mmol/L — ABNORMAL HIGH (ref 0.0–2.0)
Bicarbonate: 21.4 mmol/L (ref 20.0–28.0)
Bicarbonate: 19.3 mmol/L — ABNORMAL LOW (ref 20.0–28.0)
Calcium, Ion: 1.12 mmol/L — ABNORMAL LOW (ref 1.15–1.40)
Calcium, Ion: 0.99 mmol/L — ABNORMAL LOW (ref 1.15–1.40)
HCT: 26 % — ABNORMAL LOW (ref 39.0–52.0)
HCT: 27 % — ABNORMAL LOW (ref 39.0–52.0)
Hemoglobin: 8.8 g/dL — ABNORMAL LOW (ref 13.0–17.0)
Hemoglobin: 9.2 g/dL — ABNORMAL LOW (ref 13.0–17.0)
O2 Saturation: 100 %
O2 Saturation: 100 %
Potassium: 3.1 mmol/L — ABNORMAL LOW (ref 3.5–5.1)
Potassium: 3.9 mmol/L (ref 3.5–5.1)
Sodium: 141 mmol/L (ref 135–145)
Sodium: 142 mmol/L (ref 135–145)
TCO2: 23 mmol/L (ref 22–32)
TCO2: 21 mmol/L — ABNORMAL LOW (ref 22–32)
pCO2 arterial: 42 mmHg (ref 32.0–48.0)
pCO2 arterial: 44.5 mmHg (ref 32.0–48.0)
pH, Arterial: 7.315 — ABNORMAL LOW (ref 7.350–7.450)
pH, Arterial: 7.244 — ABNORMAL LOW (ref 7.350–7.450)
pO2, Arterial: 298 mmHg — ABNORMAL HIGH (ref 83.0–108.0)
pO2, Arterial: 309 mmHg — ABNORMAL HIGH (ref 83.0–108.0)

## 2020-10-20 LAB — ABO/RH: ABO/RH(D): B POS

## 2020-10-20 LAB — CBC
HCT: 32.4 % — ABNORMAL LOW (ref 39.0–52.0)
HCT: 26 % — ABNORMAL LOW (ref 39.0–52.0)
HCT: 26.7 % — ABNORMAL LOW (ref 39.0–52.0)
Hemoglobin: 10.7 g/dL — ABNORMAL LOW (ref 13.0–17.0)
Hemoglobin: 8.7 g/dL — ABNORMAL LOW (ref 13.0–17.0)
Hemoglobin: 9.1 g/dL — ABNORMAL LOW (ref 13.0–17.0)
MCH: 29.7 pg (ref 26.0–34.0)
MCH: 29.7 pg (ref 26.0–34.0)
MCH: 29.9 pg (ref 26.0–34.0)
MCHC: 33 g/dL (ref 30.0–36.0)
MCHC: 33.5 g/dL (ref 30.0–36.0)
MCHC: 34.1 g/dL (ref 30.0–36.0)
MCV: 90 fL (ref 80.0–100.0)
MCV: 88.7 fL (ref 80.0–100.0)
MCV: 87.8 fL (ref 80.0–100.0)
Platelets: 181 10*3/uL (ref 150–400)
Platelets: 139 10*3/uL — ABNORMAL LOW (ref 150–400)
Platelets: 98 10*3/uL — ABNORMAL LOW (ref 150–400)
RBC: 3.6 MIL/uL — ABNORMAL LOW (ref 4.22–5.81)
RBC: 2.93 MIL/uL — ABNORMAL LOW (ref 4.22–5.81)
RBC: 3.04 MIL/uL — ABNORMAL LOW (ref 4.22–5.81)
RDW: 13.8 % (ref 11.5–15.5)
RDW: 14 % (ref 11.5–15.5)
RDW: 13.6 % (ref 11.5–15.5)
WBC: 11.9 10*3/uL — ABNORMAL HIGH (ref 4.0–10.5)
WBC: 18.4 10*3/uL — ABNORMAL HIGH (ref 4.0–10.5)
WBC: 14.6 10*3/uL — ABNORMAL HIGH (ref 4.0–10.5)
nRBC: 0.2 % (ref 0.0–0.2)
nRBC: 0 % (ref 0.0–0.2)
nRBC: 0 % (ref 0.0–0.2)

## 2020-10-20 LAB — BASIC METABOLIC PANEL
Anion gap: 7 (ref 5–15)
BUN: 15 mg/dL (ref 6–20)
CO2: 19 mmol/L — ABNORMAL LOW (ref 22–32)
Calcium: 7.2 mg/dL — ABNORMAL LOW (ref 8.9–10.3)
Chloride: 113 mmol/L — ABNORMAL HIGH (ref 98–111)
Creatinine, Ser: 1.88 mg/dL — ABNORMAL HIGH (ref 0.61–1.24)
GFR, Estimated: 51 mL/min — ABNORMAL LOW (ref >60)
Glucose, Bld: 160 mg/dL — ABNORMAL HIGH (ref 70–99)
Potassium: 4.5 mmol/L (ref 3.5–5.1)
Sodium: 139 mmol/L (ref 135–145)

## 2020-10-20 LAB — PREPARE RBC (CROSSMATCH)

## 2020-10-20 LAB — HIV ANTIBODY (ROUTINE TESTING W REFLEX): HIV Screen 4th Generation wRfx: NONREACTIVE

## 2020-10-20 LAB — MRSA PCR SCREENING: MRSA by PCR: NEGATIVE

## 2020-10-20 SURGERY — LAPAROTOMY, EXPLORATORY
Anesthesia: General | Site: Ureter

## 2020-10-20 MED ORDER — PROMETHAZINE HCL 25 MG/ML IJ SOLN: 6.2500 mg | INTRAMUSCULAR | Status: DC | PRN

## 2020-10-20 MED ORDER — DIPHENHYDRAMINE HCL 12.5 MG/5ML PO ELIX
12.5000 mg | ORAL_SOLUTION | Freq: Four times a day (QID) | ORAL | Status: DC | PRN
  Filled 2020-10-20: qty 5

## 2020-10-20 MED ORDER — PIPERACILLIN-TAZOBACTAM 3.375 G IVPB
3.3750 g | Freq: Three times a day (TID) | INTRAVENOUS | Status: AC
  Administered 2020-10-20 – 2020-10-24 (×15): 3.375 g via INTRAVENOUS
  Filled 2020-10-20 (×15): qty 50

## 2020-10-20 MED ORDER — SUCCINYLCHOLINE CHLORIDE 200 MG/10ML IV SOSY
PREFILLED_SYRINGE | INTRAVENOUS | Status: DC | PRN
  Administered 2020-10-20: 120 mg via INTRAVENOUS

## 2020-10-20 MED ORDER — SUCCINYLCHOLINE CHLORIDE 200 MG/10ML IV SOSY
PREFILLED_SYRINGE | INTRAVENOUS | Status: AC
  Filled 2020-10-20: qty 10

## 2020-10-20 MED ORDER — DEXAMETHASONE SODIUM PHOSPHATE 10 MG/ML IJ SOLN
INTRAMUSCULAR | Status: AC
  Filled 2020-10-20: qty 1

## 2020-10-20 MED ORDER — ONDANSETRON HCL 4 MG/2ML IJ SOLN: 4.0000 mg | Freq: Four times a day (QID) | INTRAMUSCULAR | Status: DC | PRN

## 2020-10-20 MED ORDER — FENTANYL CITRATE (PF) 100 MCG/2ML IJ SOLN
INTRAMUSCULAR | Status: DC | PRN
  Administered 2020-10-20: 50 ug via INTRAVENOUS
  Administered 2020-10-20 (×2): 100 ug via INTRAVENOUS

## 2020-10-20 MED ORDER — ONDANSETRON HCL 4 MG/2ML IJ SOLN
INTRAMUSCULAR | Status: DC | PRN
  Administered 2020-10-20: 4 mg via INTRAVENOUS

## 2020-10-20 MED ORDER — LIDOCAINE 2% (20 MG/ML) 5 ML SYRINGE
INTRAMUSCULAR | Status: AC
  Filled 2020-10-20: qty 5

## 2020-10-20 MED ORDER — NALOXONE HCL 0.4 MG/ML IJ SOLN: 0.4000 mg | INTRAMUSCULAR | Status: DC | PRN

## 2020-10-20 MED ORDER — SODIUM CHLORIDE 0.9 % IV SOLN
INTRAVENOUS | Status: DC | PRN
  Administered 2020-10-21 – 2020-10-22 (×2): 250 mL via INTRAVENOUS

## 2020-10-20 MED ORDER — PHENYLEPHRINE HCL (PRESSORS) 10 MG/ML IV SOLN
INTRAVENOUS | Status: AC
  Filled 2020-10-20: qty 1

## 2020-10-20 MED ORDER — CHLORHEXIDINE GLUCONATE CLOTH 2 % EX PADS
6.0000 | MEDICATED_PAD | Freq: Every day | CUTANEOUS | Status: DC
  Administered 2020-10-22 – 2020-11-04 (×14): 6 via TOPICAL

## 2020-10-20 MED ORDER — FENTANYL CITRATE (PF) 100 MCG/2ML IJ SOLN
25.0000 ug | INTRAMUSCULAR | Status: DC | PRN
  Administered 2020-10-20 (×2): 50 ug via INTRAVENOUS

## 2020-10-20 MED ORDER — PHENYLEPHRINE 40 MCG/ML (10ML) SYRINGE FOR IV PUSH (FOR BLOOD PRESSURE SUPPORT)
PREFILLED_SYRINGE | INTRAVENOUS | Status: AC
  Filled 2020-10-20: qty 10

## 2020-10-20 MED ORDER — LEVETIRACETAM IN NACL 500 MG/100ML IV SOLN
500.0000 mg | Freq: Two times a day (BID) | INTRAVENOUS | Status: DC
  Administered 2020-10-20 – 2020-10-27 (×16): 500 mg via INTRAVENOUS
  Filled 2020-10-20 (×16): qty 100

## 2020-10-20 MED ORDER — PHENYLEPHRINE HCL-NACL 10-0.9 MG/250ML-% IV SOLN
INTRAVENOUS | Status: DC | PRN
  Administered 2020-10-20: 25 ug/min via INTRAVENOUS

## 2020-10-20 MED ORDER — LIDOCAINE 2% (20 MG/ML) 5 ML SYRINGE
INTRAMUSCULAR | Status: DC | PRN
  Administered 2020-10-20: 80 mg via INTRAVENOUS

## 2020-10-20 MED ORDER — PROCHLORPERAZINE EDISYLATE 10 MG/2ML IJ SOLN: 5.0000 mg | INTRAMUSCULAR | Status: DC | PRN

## 2020-10-20 MED ORDER — HYDRALAZINE HCL 20 MG/ML IJ SOLN
10.0000 mg | INTRAMUSCULAR | Status: DC | PRN
  Administered 2020-10-30: 10 mg via INTRAVENOUS
  Filled 2020-10-20: qty 0.5
  Filled 2020-10-20: qty 1

## 2020-10-20 MED ORDER — SODIUM CHLORIDE 0.9 % IR SOLN
Status: DC | PRN
  Administered 2020-10-20: 4000 mL

## 2020-10-20 MED ORDER — ROCURONIUM BROMIDE 10 MG/ML (PF) SYRINGE
PREFILLED_SYRINGE | INTRAVENOUS | Status: DC | PRN
  Administered 2020-10-20 (×2): 50 mg via INTRAVENOUS

## 2020-10-20 MED ORDER — SODIUM CHLORIDE 0.9% IV SOLUTION: Freq: Once | INTRAVENOUS | Status: DC

## 2020-10-20 MED ORDER — DIPHENHYDRAMINE HCL 50 MG/ML IJ SOLN: 12.5000 mg | Freq: Four times a day (QID) | INTRAMUSCULAR | Status: DC | PRN

## 2020-10-20 MED ORDER — SODIUM CHLORIDE 0.9 % IV SOLN
2.0000 g | INTRAVENOUS | Status: AC
  Administered 2020-10-20: 2 g via INTRAVENOUS
  Filled 2020-10-20 (×2): qty 2

## 2020-10-20 MED ORDER — LACTATED RINGERS IV BOLUS
1000.0000 mL | Freq: Once | INTRAVENOUS | Status: AC
  Administered 2020-10-20: 1000 mL via INTRAVENOUS

## 2020-10-20 MED ORDER — SODIUM CHLORIDE 0.9 % IV SOLN: INTRAVENOUS | Status: DC | PRN

## 2020-10-20 MED ORDER — FENTANYL CITRATE (PF) 100 MCG/2ML IJ SOLN
INTRAMUSCULAR | Status: AC
  Filled 2020-10-20: qty 2

## 2020-10-20 MED ORDER — ENOXAPARIN SODIUM 30 MG/0.3ML ~~LOC~~ SOLN: 30.0000 mg | Freq: Two times a day (BID) | SUBCUTANEOUS | Status: DC

## 2020-10-20 MED ORDER — DEXAMETHASONE SODIUM PHOSPHATE 10 MG/ML IJ SOLN
INTRAMUSCULAR | Status: DC | PRN
  Administered 2020-10-20: 10 mg via INTRAVENOUS

## 2020-10-20 MED ORDER — CHLORHEXIDINE GLUCONATE CLOTH 2 % EX PADS
6.0000 | MEDICATED_PAD | Freq: Once | CUTANEOUS | Status: AC
Start: 2020-10-20 — End: 2020-10-20
  Administered 2020-10-20: 6 via TOPICAL

## 2020-10-20 MED ORDER — ALBUMIN HUMAN 5 % IV SOLN
12.5000 g | Freq: Once | INTRAVENOUS | Status: AC
  Administered 2020-10-20: 12.5 g via INTRAVENOUS
  Filled 2020-10-20: qty 250

## 2020-10-20 MED ORDER — PHENYLEPHRINE 40 MCG/ML (10ML) SYRINGE FOR IV PUSH (FOR BLOOD PRESSURE SUPPORT)
PREFILLED_SYRINGE | INTRAVENOUS | Status: DC | PRN
  Administered 2020-10-20 (×2): 80 ug via INTRAVENOUS
  Administered 2020-10-20: 120 ug via INTRAVENOUS
  Administered 2020-10-20: 80 ug via INTRAVENOUS

## 2020-10-20 MED ORDER — HYDROMORPHONE 1 MG/ML IV SOLN
INTRAVENOUS | Status: DC
  Filled 2020-10-20: qty 30

## 2020-10-20 MED ORDER — PROPOFOL 10 MG/ML IV BOLUS
INTRAVENOUS | Status: AC
  Filled 2020-10-20: qty 20

## 2020-10-20 MED ORDER — LORAZEPAM 2 MG/ML IJ SOLN
0.5000 mg | Freq: Three times a day (TID) | INTRAMUSCULAR | Status: DC | PRN
  Administered 2020-10-22: 1 mg via INTRAVENOUS
  Filled 2020-10-20: qty 1

## 2020-10-20 MED ORDER — FENTANYL CITRATE (PF) 250 MCG/5ML IJ SOLN
INTRAMUSCULAR | Status: AC
  Filled 2020-10-20: qty 5

## 2020-10-20 MED ORDER — GABAPENTIN 300 MG PO CAPS
300.0000 mg | ORAL_CAPSULE | Freq: Three times a day (TID) | ORAL | Status: DC
  Administered 2020-10-20 – 2020-10-27 (×18): 300 mg via ORAL
  Filled 2020-10-20 (×18): qty 1

## 2020-10-20 MED ORDER — GABAPENTIN 250 MG/5ML PO SOLN
300.0000 mg | Freq: Three times a day (TID) | ORAL | Status: DC
  Administered 2020-10-20: 300 mg
  Filled 2020-10-20 (×3): qty 6

## 2020-10-20 MED ORDER — SUGAMMADEX SODIUM 200 MG/2ML IV SOLN
INTRAVENOUS | Status: DC | PRN
  Administered 2020-10-20: 150 mg via INTRAVENOUS

## 2020-10-20 MED ORDER — ONDANSETRON HCL 4 MG/2ML IJ SOLN
4.0000 mg | Freq: Four times a day (QID) | INTRAMUSCULAR | Status: DC | PRN
  Administered 2020-10-23 – 2020-10-24 (×3): 4 mg via INTRAVENOUS
  Filled 2020-10-20 (×4): qty 2

## 2020-10-20 MED ORDER — 0.9 % SODIUM CHLORIDE (POUR BTL) OPTIME
TOPICAL | Status: DC | PRN
  Administered 2020-10-20: 2000 mL

## 2020-10-20 MED ORDER — METHOCARBAMOL 1000 MG/10ML IJ SOLN
1000.0000 mg | Freq: Four times a day (QID) | INTRAVENOUS | Status: DC | PRN
  Administered 2020-10-23 – 2020-10-24 (×2): 1000 mg via INTRAVENOUS
  Filled 2020-10-20 (×3): qty 10
  Filled 2020-10-20: qty 1000

## 2020-10-20 MED ORDER — METOPROLOL TARTRATE 5 MG/5ML IV SOLN
5.0000 mg | Freq: Four times a day (QID) | INTRAVENOUS | Status: DC | PRN
  Administered 2020-10-30: 5 mg via INTRAVENOUS
  Filled 2020-10-20 (×3): qty 5

## 2020-10-20 MED ORDER — PHENOL 1.4 % MT LIQD
2.0000 | OROMUCOSAL | Status: DC | PRN
  Administered 2020-10-23: 2 via OROMUCOSAL
  Filled 2020-10-20: qty 177

## 2020-10-20 MED ORDER — ONDANSETRON HCL 4 MG/2ML IJ SOLN
INTRAMUSCULAR | Status: AC
  Filled 2020-10-20: qty 2

## 2020-10-20 MED ORDER — SODIUM CHLORIDE 0.9% FLUSH: 9.0000 mL | INTRAVENOUS | Status: DC | PRN

## 2020-10-20 MED ORDER — ROCURONIUM BROMIDE 10 MG/ML (PF) SYRINGE
PREFILLED_SYRINGE | INTRAVENOUS | Status: AC
  Filled 2020-10-20: qty 10

## 2020-10-20 MED ORDER — MIDAZOLAM HCL 2 MG/2ML IJ SOLN
INTRAMUSCULAR | Status: AC
  Filled 2020-10-20: qty 2

## 2020-10-20 MED ORDER — SIMETHICONE 40 MG/0.6ML PO SUSP
80.0000 mg | Freq: Four times a day (QID) | ORAL | Status: DC | PRN
  Filled 2020-10-20: qty 1.2

## 2020-10-20 MED ORDER — SODIUM CHLORIDE 0.9 % IV SOLN: INTRAVENOUS | Status: DC

## 2020-10-20 MED ORDER — LIP MEDEX EX OINT
1.0000 "application " | TOPICAL_OINTMENT | Freq: Two times a day (BID) | CUTANEOUS | Status: DC
  Administered 2020-10-20 – 2020-11-04 (×23): 1 via TOPICAL
  Filled 2020-10-20: qty 7

## 2020-10-20 MED ORDER — PROPOFOL 10 MG/ML IV BOLUS
INTRAVENOUS | Status: DC | PRN
  Administered 2020-10-20: 130 mg via INTRAVENOUS

## 2020-10-20 MED ORDER — LACTATED RINGERS IV BOLUS: 1000.0000 mL | Freq: Three times a day (TID) | INTRAVENOUS | Status: AC | PRN

## 2020-10-20 MED ORDER — ALUM & MAG HYDROXIDE-SIMETH 200-200-20 MG/5ML PO SUSP
30.0000 mL | Freq: Four times a day (QID) | ORAL | Status: DC | PRN
  Administered 2020-10-28: 30 mL via ORAL
  Filled 2020-10-20: qty 30

## 2020-10-20 MED ORDER — ONDANSETRON 4 MG PO TBDP
4.0000 mg | ORAL_TABLET | Freq: Four times a day (QID) | ORAL | Status: DC | PRN
  Administered 2020-10-22: 4 mg via ORAL
  Filled 2020-10-20: qty 1

## 2020-10-20 MED ORDER — SODIUM CHLORIDE 0.9% IV SOLUTION: Freq: Once | INTRAVENOUS | Status: AC

## 2020-10-20 MED ORDER — HYDROMORPHONE 1 MG/ML IV SOLN
INTRAVENOUS | Status: DC
  Administered 2020-10-20: 30 mg via INTRAVENOUS
  Administered 2020-10-20: 1.2 mg via INTRAVENOUS
  Administered 2020-10-20: 1.8 mg via INTRAVENOUS
  Administered 2020-10-20: 3.5 mg via INTRAVENOUS
  Administered 2020-10-20: 2.7 mg via INTRAVENOUS
  Administered 2020-10-21: 1.8 mg via INTRAVENOUS
  Administered 2020-10-21: 1.2 mg via INTRAVENOUS
  Administered 2020-10-21: 2.1 mg via INTRAVENOUS

## 2020-10-20 MED ORDER — CEFAZOLIN SODIUM-DEXTROSE 2-4 GM/100ML-% IV SOLN
2.0000 g | Freq: Three times a day (TID) | INTRAVENOUS | Status: DC
  Filled 2020-10-20: qty 100

## 2020-10-20 MED ORDER — HYDROMORPHONE HCL 1 MG/ML IJ SOLN
INTRAMUSCULAR | Status: DC | PRN
  Administered 2020-10-20: .5 mg via INTRAVENOUS
  Administered 2020-10-20: 1 mg via INTRAVENOUS
  Administered 2020-10-20: .5 mg via INTRAVENOUS

## 2020-10-20 MED ORDER — MENTHOL 3 MG MT LOZG
1.0000 | LOZENGE | OROMUCOSAL | Status: DC | PRN
  Filled 2020-10-20: qty 9

## 2020-10-20 MED ORDER — HYDROMORPHONE HCL 2 MG/ML IJ SOLN
INTRAMUSCULAR | Status: AC
  Filled 2020-10-20: qty 1

## 2020-10-20 MED ORDER — MAGIC MOUTHWASH
15.0000 mL | Freq: Four times a day (QID) | ORAL | Status: DC | PRN
  Filled 2020-10-20: qty 15

## 2020-10-20 MED ORDER — MIDAZOLAM HCL 5 MG/5ML IJ SOLN
INTRAMUSCULAR | Status: DC | PRN
  Administered 2020-10-20: 2 mg via INTRAVENOUS

## 2020-10-20 SURGICAL SUPPLY — 53 items
CANISTER WOUND CARE 500ML ATS (WOUND CARE) ×7 IMPLANT
CATH INTERMIT  6FR 70CM (CATHETERS) ×14 IMPLANT
CHLORAPREP W/TINT 26 (MISCELLANEOUS) IMPLANT
COVER MAYO STAND STRL (DRAPES) ×7 IMPLANT
COVER SURGICAL LIGHT HANDLE (MISCELLANEOUS) ×7 IMPLANT
COVER WAND RF STERILE (DRAPES) IMPLANT
DRAIN CHANNEL 19F RND (DRAIN) ×7 IMPLANT
DRAPE LAPAROSCOPIC ABDOMINAL (DRAPES) ×7 IMPLANT
DRSG OPSITE POSTOP 4X10 (GAUZE/BANDAGES/DRESSINGS) IMPLANT
DRSG OPSITE POSTOP 4X6 (GAUZE/BANDAGES/DRESSINGS) IMPLANT
DRSG OPSITE POSTOP 4X8 (GAUZE/BANDAGES/DRESSINGS) IMPLANT
DRSG TEGADERM 2-3/8X2-3/4 SM (GAUZE/BANDAGES/DRESSINGS) ×7 IMPLANT
DRSG VAC ATS MED SENSATRAC (GAUZE/BANDAGES/DRESSINGS) ×7 IMPLANT
ELECT REM PT RETURN 15FT ADLT (MISCELLANEOUS) ×7 IMPLANT
EVACUATOR DRAINAGE 10X20 100CC (DRAIN) ×5 IMPLANT
EVACUATOR SILICONE 100CC (DRAIN) ×4 IMPLANT
GLOVE SURG ENC MOIS LTX SZ6 (GLOVE) ×14 IMPLANT
GLOVE SURG UNDER LTX SZ6.5 (GLOVE) ×14 IMPLANT
GOWN STRL REUS W/ TWL LRG LVL3 (GOWN DISPOSABLE) ×15 IMPLANT
GOWN STRL REUS W/ TWL XL LVL3 (GOWN DISPOSABLE) ×20 IMPLANT
GOWN STRL REUS W/TWL LRG LVL3 (GOWN DISPOSABLE) ×16 IMPLANT
GOWN STRL REUS W/TWL XL LVL3 (GOWN DISPOSABLE) ×23 IMPLANT
GUIDEWIRE STR DUAL SENSOR (WIRE) ×14 IMPLANT
KIT TURNOVER KIT A (KITS) ×7 IMPLANT
LIGASURE IMPACT 36 18CM CVD LR (INSTRUMENTS) ×7 IMPLANT
PACK GENERAL/GYN (CUSTOM PROCEDURE TRAY) ×7 IMPLANT
PENCIL SMOKE EVACUATOR (MISCELLANEOUS) IMPLANT
POUCH OSTOMY 2 3/4  H 3804 (WOUND CARE) ×5
POUCH OSTOMY 2 PC DRNBL 2.75 (WOUND CARE) ×5 IMPLANT
RELOAD GRN CONTOUR (ENDOMECHANICALS) ×7 IMPLANT
SPONGE LAP 18X18 RF (DISPOSABLE) IMPLANT
STAPLER CVD CUT GN 40 RELOAD (ENDOMECHANICALS) ×7 IMPLANT
STAPLER VISISTAT 35W (STAPLE) IMPLANT
SUT ETHILON 3 0 PS 1 (SUTURE) IMPLANT
SUT MNCRL AB 4-0 PS2 18 (SUTURE) IMPLANT
SUT NOVA T20/GS 25 (SUTURE) IMPLANT
SUT PDS AB 1 TP1 96 (SUTURE) ×14 IMPLANT
SUT PROLENE 2 0 SH DA (SUTURE) ×14 IMPLANT
SUT SILK 2 0 (SUTURE) ×3
SUT SILK 2 0 SH CR/8 (SUTURE) ×7 IMPLANT
SUT SILK 2-0 18XBRD TIE 12 (SUTURE) ×5 IMPLANT
SUT SILK 3 0 (SUTURE) ×4
SUT SILK 3 0 SH CR/8 (SUTURE) ×7 IMPLANT
SUT SILK 3-0 18XBRD TIE 12 (SUTURE) ×5 IMPLANT
SUT V-LOC BARB 180 2/0GR6 GS22 (SUTURE) ×7
SUT VIC AB 2-0 SH 18 (SUTURE) ×14 IMPLANT
SUT VIC AB 2-0 SH 27 (SUTURE) ×21
SUT VIC AB 2-0 SH 27X BRD (SUTURE) ×35 IMPLANT
SUT VIC AB 2-0 UR5 27 (SUTURE) ×21 IMPLANT
SUTURE V-LC BRB 180 2/0GR6GS22 (SUTURE) ×5 IMPLANT
TOWEL OR 17X26 10 PK STRL BLUE (TOWEL DISPOSABLE) IMPLANT
TOWEL OR NON WOVEN STRL DISP B (DISPOSABLE) ×7 IMPLANT
TRAY FOLEY MTR SLVR 16FR STAT (SET/KITS/TRAYS/PACK) ×7 IMPLANT
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
## 2020-10-20 NOTE — ED Notes (Signed)
Patient complaining of left leg pain. He said it feels numb. Dr. Rodena Medin at bedside.
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
## 2020-10-20 NOTE — Anesthesia Procedure Notes (Signed)
Arterial Line Insertion Start/End4/14/2022 12:54 AM, 10/20/2020 1:04 AM Performed by: Cecile Hearing, MD, anesthesiologist  Patient location: OR. Preanesthetic checklist: patient identified, IV checked, site marked, risks and benefits discussed, surgical consent, monitors and equipment checked, pre-op evaluation, timeout performed and anesthesia consent Lidocaine 1% used for infiltration Left, radial was placed Catheter size: 20 Fr Hand hygiene performed  and maximum sterile barriers used   Attempts: 1 Procedure performed without using ultrasound guided technique. Following insertion, dressing applied and Biopatch. Post procedure assessment: normal and unchanged  Patient tolerated the procedure well with no immediate complications.
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
## 2020-10-20 NOTE — ED Notes (Signed)
Patients father signed for consent to transfer.

## 2020-10-20 NOTE — Anesthesia Procedure Notes (Signed)
Procedure Name: Intubation Date/Time: 10/20/2020 12:52 AM Performed by: Vaunda Gutterman D, CRNA Pre-anesthesia Checklist: Patient identified, Emergency Drugs available, Suction available and Patient being monitored Patient Re-evaluated:Patient Re-evaluated prior to induction Oxygen Delivery Method: Circle system utilized Preoxygenation: Pre-oxygenation with 100% oxygen Induction Type: IV induction and Rapid sequence Laryngoscope Size: Mac and 4 Grade View: Grade I Tube type: Oral Tube size: 7.5 mm Number of attempts: 1 Airway Equipment and Method: Stylet Placement Confirmation: ETT inserted through vocal cords under direct vision,  positive ETCO2 and breath sounds checked- equal and bilateral Secured at: 22 cm Tube secured with: Tape Dental Injury: Teeth and Oropharynx as per pre-operative assessment

## 2020-10-20 NOTE — Transfer of Care (Signed)
Immediate Anesthesia Transfer of Care Note  Patient: Jonathan [name]  Procedure(s) Performed: EXPLORATORY LAPAROTOMY (N/A Abdomen) LOWER ANTERIOR RECTAL REPAIR AND SEROSAL REPAIR (Rectum) COLOSTOMY (Left Abdomen) EXPLORATIONAL CYSTOSCOPY (Left Ureter) EXPLORATION OF BLADDER  AND REPAIR (N/A Bladder)  Patient Location: PACU  Anesthesia Type:General  Level of Consciousness: awake, alert  and oriented  Airway & Oxygen Therapy: Patient Spontanous Breathing and Patient connected to face mask oxygen  Post-op Assessment: Report given to RN and Post -op Vital signs reviewed and stable  Post vital signs: Reviewed and stable  Last Vitals:  Vitals Value Taken Time  BP 148/79 10/20/20 0345  Temp    Pulse 117 10/20/20 0347  Resp 31 10/20/20 0347  SpO2 100 % 10/20/20 0347  Vitals shown include unvalidated device data.  Last Pain:  Vitals:   10/20/20 0004  TempSrc:   PainSc: 10-Worst pain ever         Complications: No complications documented.

## 2020-10-20 NOTE — TOC CAGE-AID Note (Signed)
Transition of Care [facility]) - CAGE-AID Screening   Patient Details  Name: Jonathan [name] MRN: [id number] Date of Birth: 05-07-2001  Clinical Narrative:  Patient endorses drinking alcohol. Patient states "his mom criticizes his drinking habits", but he does not feel he has a problem, nor does he need resources at this time.   CAGE-AID Screening: Substance Abuse Screening unable to be completed due to: : Patient unable to participate (Intubated)  Have You Ever Felt You Ought to Cut Down on Your Drinking or Drug Use?: No Have People Annoyed You By Critizing Your Drinking Or Drug Use?: Yes Have You Felt Bad Or Guilty About Your Drinking Or Drug Use?: No Have You Ever Had a Drink or Used Drugs First Thing In The Morning to Steady Your Nerves or to Get Rid of a Hangover?: No CAGE-AID Score: 1  Substance Abuse Education Offered: No

## 2020-10-20 NOTE — ED Notes (Signed)
Patient wiped down with chlorhexidine wipes. Patients informed consent signed and at bedside.
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
## 2020-10-20 NOTE — Anesthesia Postprocedure Evaluation (Signed)
Anesthesia Post Note  Patient: Jonathan [name]  Procedure(s) Performed: EXPLORATORY LAPAROTOMY (N/A Abdomen) LOWER ANTERIOR RECTAL REPAIR AND SEROSAL REPAIR (Rectum) COLOSTOMY (Left Abdomen) EXPLORATIONAL CYSTOSCOPY (Left Ureter) EXPLORATION OF BLADDER  AND REPAIR (N/A Bladder)     Patient location during evaluation: PACU Anesthesia Type: General Level of consciousness: awake and alert, oriented and awake Pain management: pain level controlled Vital Signs Assessment: post-procedure vital signs reviewed and stable Respiratory status: spontaneous breathing, nonlabored ventilation and respiratory function stable Cardiovascular status: blood pressure returned to baseline, stable and tachycardic Postop Assessment: no apparent nausea or vomiting Anesthetic complications: no   No complications documented.  Last Vitals:  Vitals:   10/20/20 0415 10/20/20 0430  BP: 132/88 (!) 128/91  Pulse: (!) 114 (!) 133  Resp: (!) 24 (!) 21  Temp:    SpO2: 100% 98%    Last Pain:  Vitals:   10/20/20 0004  TempSrc:   PainSc: 10-Worst pain ever                 Cecile Hearing

## 2020-10-20 NOTE — ED Notes (Signed)
Dr. Michaell Cowing at bedside assessing patients back side.

## 2020-10-20 NOTE — Consult Note (Signed)
WOC Nurse Consult Note: Patient receiving care in [facility]N25. Reason for Consult: VAC dressing changes to midline abdominal wound. Original dressing placed 4/14.  Next dressing due Friday 4/15. Wound type: surgical 2 VAC dressings, Hart [place] [phone] requested.  WOC Nurse ostomy follow up Stoma type/location: LUQ colostomy  Stomal assessment/size: 1 5/8 inches, round, budded, edematous, moist Peristomal assessment: deferred Treatment options for stomal/peristomal skin: barrier rings if needed Output: none at this time  Ostomy pouching: 1pc. Hart [place] [phone] requested  Education provided: none Enrolled patient in [place] Secure Start Discharge program: No Helmut Muster, RN, MSN, [facility], CNS-BC, pager [phone]
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
## 2020-10-21 ENCOUNTER — Encounter (HOSPITAL_COMMUNITY): Payer: Self-pay | Admitting: Surgery

## 2020-10-21 LAB — PREPARE RBC (CROSSMATCH)

## 2020-10-21 LAB — TYPE AND SCREEN
ABO/RH(D): B POS
Antibody Screen: NEGATIVE
Unit division: 0
Unit division: 0

## 2020-10-21 LAB — BASIC METABOLIC PANEL
Anion gap: 6 (ref 5–15)
BUN: 22 mg/dL — ABNORMAL HIGH (ref 6–20)
CO2: 21 mmol/L — ABNORMAL LOW (ref 22–32)
Calcium: 7.3 mg/dL — ABNORMAL LOW (ref 8.9–10.3)
Chloride: 107 mmol/L (ref 98–111)
Creatinine, Ser: 1.39 mg/dL — ABNORMAL HIGH (ref 0.61–1.24)
GFR, Estimated: >60 mL/min (ref >60)
Glucose, Bld: 116 mg/dL — ABNORMAL HIGH (ref 70–99)
Potassium: 4.2 mmol/L (ref 3.5–5.1)
Sodium: 134 mmol/L — ABNORMAL LOW (ref 135–145)

## 2020-10-21 LAB — CBC
HCT: 22.8 % — ABNORMAL LOW (ref 39.0–52.0)
HCT: 24.4 % — ABNORMAL LOW (ref 39.0–52.0)
Hemoglobin: 7.7 g/dL — ABNORMAL LOW (ref 13.0–17.0)
Hemoglobin: 8.5 g/dL — ABNORMAL LOW (ref 13.0–17.0)
MCH: 29.5 pg (ref 26.0–34.0)
MCH: 30.8 pg (ref 26.0–34.0)
MCHC: 33.8 g/dL (ref 30.0–36.0)
MCHC: 34.8 g/dL (ref 30.0–36.0)
MCV: 87.4 fL (ref 80.0–100.0)
MCV: 88.4 fL (ref 80.0–100.0)
Platelets: 94 10*3/uL — ABNORMAL LOW (ref 150–400)
Platelets: 86 10*3/uL — ABNORMAL LOW (ref 150–400)
RBC: 2.61 MIL/uL — ABNORMAL LOW (ref 4.22–5.81)
RBC: 2.76 MIL/uL — ABNORMAL LOW (ref 4.22–5.81)
RDW: 13.9 % (ref 11.5–15.5)
RDW: 13.9 % (ref 11.5–15.5)
WBC: 11.2 10*3/uL — ABNORMAL HIGH (ref 4.0–10.5)
WBC: 11 10*3/uL — ABNORMAL HIGH (ref 4.0–10.5)
nRBC: 0 % (ref 0.0–0.2)
nRBC: 0 % (ref 0.0–0.2)

## 2020-10-21 LAB — BPAM RBC
Blood Product Expiration Date: 202205072359
Blood Product Expiration Date: 202205072359
ISSUE DATE / TIME: 202204140205
ISSUE DATE / TIME: 202204140205
Unit Type and Rh: 7300
Unit Type and Rh: 7300

## 2020-10-21 LAB — SURGICAL PATHOLOGY

## 2020-10-21 MED ORDER — SODIUM CHLORIDE 0.9 % IV BOLUS
1000.0000 mL | Freq: Once | INTRAVENOUS | Status: AC
  Administered 2020-10-21: 1000 mL via INTRAVENOUS

## 2020-10-21 MED ORDER — SODIUM CHLORIDE 0.9% IV SOLUTION: Freq: Once | INTRAVENOUS | Status: AC
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
## 2020-10-21 NOTE — Progress Notes (Signed)
CT scan reviewed Sacral fracture and lower lumbar transverse process fracture which is comminuted present on the left-hand side Recommend touchdown weightbearing for transfers and ambulation on the left-hand side He will need to follow-up in 3 weeks for repeat radiographs and advancement of weightbearing status

## 2020-10-21 NOTE — Consult Note (Addendum)
WOC Nurse Consult Note: Reason for Consult: Abd Vac dressing change performed (surgical team is following for buttocks wounds). Wound type: Full thickness midline abd post-op wound; beefy red with small amt pink drainage. Upper half of the wound is shallow; approx  .2cm depth, lower half is approx .4cm depth.  Wound is 24X4cm Medicated prior to procedure and tolerated with mod amt discomfort. Applied one piece black foam to cont suction.  WOC team will plan to perform dressing changes Q M/W/F  WOC Nurse ostomy consult note Stoma type/location: Pt had colostomy surgery performed on 4/14.   Stomal assessment/size: Stoma is red and viable, 1 3/4 inches, above skin level, slightly edematous Peristomal assessment: intact skin surrounding Output: 50 cc pink liquid in pouch, no stool or flatus yet Ostomy pouching: 1pc. Applied barrier ring and one piece pouch. 3 sets of barrier rings and pouches left at the bedside for staff nurse use Hart [place] # [phone] and Gaston Islam) Education provided:  He is currently in pain and upset and tearful; on the phone arguing with someone, not ready for teaching.  No family members present.  WOC team will begin teaching sessions when pt is stable and out of ICU.  Enrolled patient in [place] Secure Start DC program: Not yet Cammie Mcgee MSN, RN, [place], [place], [place] [phone]

## 2020-10-21 NOTE — Progress Notes (Signed)
Phlebotomy updated that this RN is unable to get blood to draw off of patient's arterial line for labs, so they will have to draw morning labs. This RN flushed line and manipulated patient's wrist position to get better blood return, to no effect.
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
## 2020-10-21 NOTE — Progress Notes (Signed)
Inpatient Rehab Admissions Coordinator Note:   Per PT recommendations, pt was screened for CIR candidacy by Estill Dooms, PT, DPT.  At this time we are recommending a CIR consult and I will place an order per our protocol.  Please contact me with questions.   Estill Dooms, PT, DPT [phone] 10/21/20 11:01 AM

## 2020-10-21 NOTE — TOC Progression Note (Signed)
Transition of Care [facility]) - Progression Note    Patient Details  Name: Jonathan [name] MRN: [id number] Date of Birth: February 02, 2001  Transition of [facility]) CM/SW Contact  Ella Bodo, RN Phone Number: 10/21/2020,1215pm  Clinical Narrative:  Met with pt and roommate, per his request.  He is interested in applying for Medicaid and/or disability.  Will send email to [employer] counselor for follow up.       Expected Discharge Plan: IP Rehab Facility Barriers to Discharge: Continued Medical Work up  Expected Discharge Plan and Services Expected Discharge Plan: [place]   Discharge Planning Services: CM Consult   Living arrangements for the past 2 months: Apartment                                       Social Determinants of Health (SDOH) Interventions    Readmission Risk Interventions No flowsheet data found.  Reinaldo Raddle, RN, BSN  Trauma/Neuro ICU Case Manager [phone]

## 2020-10-21 NOTE — Progress Notes (Signed)
OT Cancellation Note  Patient Details Name: Jonathan [name] MRN: [id number] DOB: [birth year]   Cancelled Treatment:    Reason Eval/Treat Not Completed: Other (comment) (Awaiting ortho consult for possible L sacrum fx. Will return as schedule allows.)  Jonathan [name] M [name] Jonathan [name] MSOT, OTR/L Acute Rehab Pager: [phone] Office: [phone] 10/21/2020, 12:46 PM
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
## 2020-10-22 ENCOUNTER — Inpatient Hospital Stay (HOSPITAL_COMMUNITY): Payer: Self-pay

## 2020-10-22 DIAGNOSIS — S3210XA Unspecified fracture of sacrum, initial encounter for closed fracture: Secondary | ICD-10-CM

## 2020-10-22 LAB — CBC
HCT: 21.4 % — ABNORMAL LOW (ref 39.0–52.0)
HCT: 20.9 % — ABNORMAL LOW (ref 39.0–52.0)
Hemoglobin: 7.4 g/dL — ABNORMAL LOW (ref 13.0–17.0)
Hemoglobin: 7.1 g/dL — ABNORMAL LOW (ref 13.0–17.0)
MCH: 29.7 pg (ref 26.0–34.0)
MCH: 29.3 pg (ref 26.0–34.0)
MCHC: 34.6 g/dL (ref 30.0–36.0)
MCHC: 34 g/dL (ref 30.0–36.0)
MCV: 85.9 fL (ref 80.0–100.0)
MCV: 86.4 fL (ref 80.0–100.0)
Platelets: 94 10*3/uL — ABNORMAL LOW (ref 150–400)
Platelets: 101 10*3/uL — ABNORMAL LOW (ref 150–400)
RBC: 2.49 MIL/uL — ABNORMAL LOW (ref 4.22–5.81)
RBC: 2.42 MIL/uL — ABNORMAL LOW (ref 4.22–5.81)
RDW: 13.7 % (ref 11.5–15.5)
RDW: 13.3 % (ref 11.5–15.5)
WBC: 10.7 10*3/uL — ABNORMAL HIGH (ref 4.0–10.5)
WBC: 10.6 10*3/uL — ABNORMAL HIGH (ref 4.0–10.5)
nRBC: 0 % (ref 0.0–0.2)
nRBC: 0 % (ref 0.0–0.2)

## 2020-10-22 LAB — BASIC METABOLIC PANEL
Anion gap: 6 (ref 5–15)
BUN: 13 mg/dL (ref 6–20)
CO2: 25 mmol/L (ref 22–32)
Calcium: 7.9 mg/dL — ABNORMAL LOW (ref 8.9–10.3)
Chloride: 104 mmol/L (ref 98–111)
Creatinine, Ser: 1.14 mg/dL (ref 0.61–1.24)
GFR, Estimated: >60 mL/min (ref >60)
Glucose, Bld: 108 mg/dL — ABNORMAL HIGH (ref 70–99)
Potassium: 3.8 mmol/L (ref 3.5–5.1)
Sodium: 135 mmol/L (ref 135–145)

## 2020-10-22 MED ORDER — OXYCODONE HCL 5 MG PO TABS
5.0000 mg | ORAL_TABLET | ORAL | Status: DC | PRN
  Administered 2020-10-22 – 2020-10-25 (×7): 5 mg via ORAL
  Filled 2020-10-22 (×7): qty 1

## 2020-10-22 MED ORDER — HYDROMORPHONE HCL 1 MG/ML IJ SOLN
0.5000 mg | INTRAMUSCULAR | Status: DC | PRN
Start: 2020-10-22 — End: 2020-10-27
  Administered 2020-10-22 – 2020-10-27 (×31): 1 mg via INTRAVENOUS
  Filled 2020-10-22 (×31): qty 1

## 2020-10-22 MED ORDER — ALPRAZOLAM 0.25 MG PO TABS
0.2500 mg | ORAL_TABLET | Freq: Three times a day (TID) | ORAL | Status: DC | PRN
  Administered 2020-10-22 – 2020-11-03 (×19): 0.25 mg via ORAL
  Filled 2020-10-22 (×19): qty 1

## 2020-10-22 MED ORDER — ACETAMINOPHEN 10 MG/ML IV SOLN
1000.0000 mg | Freq: Four times a day (QID) | INTRAVENOUS | Status: AC
  Administered 2020-10-22 – 2020-10-23 (×4): 1000 mg via INTRAVENOUS
  Filled 2020-10-22 (×4): qty 100
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
## 2020-10-22 NOTE — Progress Notes (Signed)
Orthopedic Tech Progress Note Patient Details:  Jonathan [name] 2001-05-08 [id number]  Ortho Devices Type of Ortho Device: Prafo boot/shoe Ortho Device/Splint Location: LLE Ortho Device/Splint Interventions: Application,Ordered   Post Interventions Patient Tolerated: Well   Tim Corriher A [name] 10/22/2020, 5:03 PM
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
## 2020-10-22 NOTE — Progress Notes (Signed)
Called ortho tech to get AFO boot; to be brought up.

## 2020-10-22 NOTE — Progress Notes (Signed)
OT Cancellation Note  Patient Details Name: Jonathan [name] MRN: [id number] DOB: [birth year]   Cancelled Treatment:    Reason Eval/Treat Not Completed: Medical issues which prohibited therapy.  RN reports he just had NG tube placed and is very uncomfortable.  Will hold at this time, and will check back.  Eber Jones., OTR/L Acute Rehabilitation Services Pager [phone] Office [phone]   Jeani Hawking M 10/22/2020, 10:47 AM

## 2020-10-22 NOTE — Progress Notes (Signed)
When going to reassess patient he reported his NG tube coming out when he was rubbing his nose, less than 62ml of gastric secretions were present since 1900, patient reported having no nausea. Dr Janee Morn paged and told this RN to not reinsert NG tube.

## 2020-10-22 NOTE — Progress Notes (Signed)
Inpatient Rehab Admissions Coordinator:   Met with patient at bedside to discuss potential CIR admission. Pt. Stated interest. Pt.'s uncle was present for conversation and states that Pt.'s mother will provide 24/7 assist at discharge. I called and left a voicemail with Pt.'s mother with request for callback. Pt. Is currently not medically ready for CIR, but I  will pursue for potential admit next week, pending bed availability and medical readiness.    Clemens Catholic, [place], [place] Admissions Coordinator  [phone] ([place]) [phone] (office)

## 2020-10-22 NOTE — Plan of Care (Signed)

## 2020-10-23 ENCOUNTER — Inpatient Hospital Stay (HOSPITAL_COMMUNITY): Payer: Self-pay

## 2020-10-23 LAB — BASIC METABOLIC PANEL
Anion gap: 7 (ref 5–15)
BUN: 12 mg/dL (ref 6–20)
CO2: 25 mmol/L (ref 22–32)
Calcium: 7.8 mg/dL — ABNORMAL LOW (ref 8.9–10.3)
Chloride: 104 mmol/L (ref 98–111)
Creatinine, Ser: 0.97 mg/dL (ref 0.61–1.24)
GFR, Estimated: >60 mL/min (ref >60)
Glucose, Bld: 95 mg/dL (ref 70–99)
Potassium: 3.4 mmol/L — ABNORMAL LOW (ref 3.5–5.1)
Sodium: 136 mmol/L (ref 135–145)

## 2020-10-23 LAB — CBC
HCT: 19.7 % — ABNORMAL LOW (ref 39.0–52.0)
HCT: 24.3 % — ABNORMAL LOW (ref 39.0–52.0)
Hemoglobin: 6.7 g/dL — CL (ref 13.0–17.0)
Hemoglobin: 8.2 g/dL — ABNORMAL LOW (ref 13.0–17.0)
MCH: 30 pg (ref 26.0–34.0)
MCH: 30.1 pg (ref 26.0–34.0)
MCHC: 34 g/dL (ref 30.0–36.0)
MCHC: 33.7 g/dL (ref 30.0–36.0)
MCV: 88.3 fL (ref 80.0–100.0)
MCV: 89.3 fL (ref 80.0–100.0)
Platelets: 110 10*3/uL — ABNORMAL LOW (ref 150–400)
Platelets: 131 10*3/uL — ABNORMAL LOW (ref 150–400)
RBC: 2.23 MIL/uL — ABNORMAL LOW (ref 4.22–5.81)
RBC: 2.72 MIL/uL — ABNORMAL LOW (ref 4.22–5.81)
RDW: 13.6 % (ref 11.5–15.5)
RDW: 13.7 % (ref 11.5–15.5)
WBC: 10.3 10*3/uL (ref 4.0–10.5)
WBC: 10.3 10*3/uL (ref 4.0–10.5)
nRBC: 0 % (ref 0.0–0.2)
nRBC: 0.3 % — ABNORMAL HIGH (ref 0.0–0.2)

## 2020-10-23 LAB — PREPARE RBC (CROSSMATCH)

## 2020-10-23 MED ORDER — SODIUM CHLORIDE 0.9% IV SOLUTION: Freq: Once | INTRAVENOUS | Status: DC
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
## 2020-10-23 NOTE — Progress Notes (Signed)
Dr Janee Morn paged of critical hemoglobin level of 6.7, verbal order received to transfuse 1 unit PRBC/  0445- patient vomited of bilious looking fluid, abdomen taut and distended, patient reports relief after zofran and rest

## 2020-10-23 NOTE — Progress Notes (Signed)
Patient reports abdominal distension and "tension" getting worse, visibly uncomfortable, this RN and Scientist, research (life sciences) placed 14F NG tube in R nare and patient vomited green fluid and green fluid was visible coming from NG tube with low wall intermittent suction. Patient reported relief after NG tube insertion.

## 2020-10-23 NOTE — Progress Notes (Signed)
Portable abdominal xray done, NG tube not in correct placement, no drainage from the NG since placement early this morning. Patient vomited the tube out, This nurse tried to place the NG back, patient vomited during placement, he did not want me to continue, he wants to be put to sleep to have the tube placed. Awaiting call from MD.

## 2020-10-23 NOTE — Progress Notes (Signed)
Patient ID: Jonathan [name], male   DOB: [birth year], 20 y.o.   MRN: [id number] BP (!) 149/84 (BP Location: Right Arm)   Pulse 98   Temp 97.6 F (36.4 C) (Oral)   Resp (!) 24   Ht 6' (1.829 m)   Wt 81.5 kg   SpO2 95%   BMI 24.37 kg/m  Alert moving left lower extremity, decreased strength better than yesterday Continue to monitor

## 2020-10-24 LAB — TYPE AND SCREEN
ABO/RH(D): B POS
Antibody Screen: NEGATIVE
Unit division: 0
Unit division: 0
Unit division: 0
Unit division: 0
Unit division: 0

## 2020-10-24 LAB — BPAM RBC
Blood Product Expiration Date: 202204242359
Blood Product Expiration Date: 202205052359
Blood Product Expiration Date: 202205052359
Blood Product Expiration Date: 202205062359
Blood Product Expiration Date: 202205082359
ISSUE DATE / TIME: 202204141504
ISSUE DATE / TIME: 202204151034
ISSUE DATE / TIME: 202204170441
ISSUE DATE / TIME: 202204180115
Unit Type and Rh: 7300
Unit Type and Rh: 7300
Unit Type and Rh: 7300
Unit Type and Rh: 7300
Unit Type and Rh: 7300

## 2020-10-24 LAB — CBC
HCT: 25.1 % — ABNORMAL LOW (ref 39.0–52.0)
Hemoglobin: 8.3 g/dL — ABNORMAL LOW (ref 13.0–17.0)
MCH: 29.6 pg (ref 26.0–34.0)
MCHC: 33.1 g/dL (ref 30.0–36.0)
MCV: 89.6 fL (ref 80.0–100.0)
Platelets: 148 10*3/uL — ABNORMAL LOW (ref 150–400)
RBC: 2.8 MIL/uL — ABNORMAL LOW (ref 4.22–5.81)
RDW: 13.8 % (ref 11.5–15.5)
WBC: 10.2 10*3/uL (ref 4.0–10.5)
nRBC: 0.2 % (ref 0.0–0.2)

## 2020-10-24 LAB — BASIC METABOLIC PANEL
Anion gap: 12 (ref 5–15)
BUN: 13 mg/dL (ref 6–20)
CO2: 22 mmol/L (ref 22–32)
Calcium: 8.1 mg/dL — ABNORMAL LOW (ref 8.9–10.3)
Chloride: 103 mmol/L (ref 98–111)
Creatinine, Ser: 0.96 mg/dL (ref 0.61–1.24)
GFR, Estimated: >60 mL/min (ref >60)
Glucose, Bld: 87 mg/dL (ref 70–99)
Potassium: 3.4 mmol/L — ABNORMAL LOW (ref 3.5–5.1)
Sodium: 137 mmol/L (ref 135–145)

## 2020-10-24 LAB — PREPARE RBC (CROSSMATCH)

## 2020-10-24 LAB — CREATININE, FLUID (PLEURAL, PERITONEAL, JP DRAINAGE): Creat, Fluid: 0.9 mg/dL

## 2020-10-24 MED ORDER — METHOCARBAMOL 1000 MG/10ML IJ SOLN
1000.0000 mg | Freq: Three times a day (TID) | INTRAVENOUS | Status: DC
  Administered 2020-10-24 – 2020-10-25 (×3): 1000 mg via INTRAVENOUS
  Filled 2020-10-24 (×2): qty 10
  Filled 2020-10-24: qty 1000

## 2020-10-24 MED ORDER — ACETAMINOPHEN 10 MG/ML IV SOLN
1000.0000 mg | Freq: Four times a day (QID) | INTRAVENOUS | Status: AC
  Administered 2020-10-24 – 2020-10-25 (×4): 1000 mg via INTRAVENOUS
  Filled 2020-10-24 (×5): qty 100

## 2020-10-24 MED ORDER — POTASSIUM CHLORIDE 10 MEQ/100ML IV SOLN
10.0000 meq | INTRAVENOUS | Status: AC
Start: 2020-10-24 — End: 2020-10-24
  Administered 2020-10-24 (×3): 10 meq via INTRAVENOUS
  Filled 2020-10-24 (×3): qty 100

## 2020-10-24 MED ORDER — WHITE PETROLATUM EX OINT
TOPICAL_OINTMENT | CUTANEOUS | Status: AC
  Filled 2020-10-24: qty 28.35

## 2020-10-24 MED ORDER — DIPHENHYDRAMINE HCL 50 MG/ML IJ SOLN
25.0000 mg | Freq: Every evening | INTRAMUSCULAR | Status: DC | PRN
  Administered 2020-10-24 – 2020-10-28 (×3): 25 mg via INTRAVENOUS
  Filled 2020-10-24 (×3): qty 1

## 2020-10-24 MED ORDER — LIP MEDEX EX OINT
TOPICAL_OINTMENT | CUTANEOUS | Status: DC | PRN
  Filled 2020-10-24: qty 7

## 2020-10-24 NOTE — Progress Notes (Signed)
Inpatient Rehab Admissions Coordinator:   I met with Pt. In his room for ongoing discussion regarding potential CIR admit. His grandmother was present and stated that Pt. Will have 24/7 support at d/c, but I have not yet been able to reach Pt.'s mother who she states will be providing support. Pt. Is not yet medically ready but I will follow for potential admit pending medical readiness and bed availability.   Clemens Catholic, [place], [place] Admissions Coordinator  [phone] ([place]) [phone] (office)

## 2020-10-24 NOTE — Progress Notes (Signed)
Paged on call provider at [phone] due to breakthrough pain and no current available PRNs, no new orders at this time
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
## 2020-10-24 NOTE — Consult Note (Addendum)
WOC Nurse wound follow up Wound type:midline abdominal wound.  NPWT (VAC) dressing in place.  Jonathan Needle, PA at bedside and agrees that wound has improved enough to transition over to NS moist gauze ID.   Measurement:23 cm x 3 cm x 0.2 cm 100% beefy red and moist  Wound bed:100% beefy red Drainage (amount, consistency, odor) minimal serosanguinous  No odor.  Periwound:LUQ colostomy Dressing procedure/placement/frequency:Stop VAc.  Cleanse midline wound with NS and pat dry.  Apply NS moist gauze to wound bed.  Cover with dry gauze and ABD pad/tape.  Change BID.  WOC Nurse ostomy follow up Stoma type/location: LUQ colostomy Stomal assessment/size: 1 3/4" pink and moist, edematous, well budded.  Peristomal assessment: intact  Midline abdominal wound.  Treatment options for stomal/peristomal skin: barrier ring and 1 piece flat pouch  Output blood tinged liquid in pouch  Ostomy pouching: 1pc. Pouch with barrier ring Education provided:  Spoke with patient about beginning to learn ostomy care.  He is in agreement.  He observes pouch change today.  We discuss twice weekly pouch changes and emptying when 1/3 full.  Currently has scant output and NG tube in place.  Enrolled patient in [place] Secure Start Discharge program: No.   Will follow.  Jonathan Hudson MSN, RN, FNP-BC CWON Wound, Ostomy, Continence Nurse Pager [phone]

## 2020-10-24 NOTE — Plan of Care (Signed)
  Problem: Education: Goal: Knowledge of General Education information will improve Description Including pain rating scale, medication(s)/side effects and non-pharmacologic comfort measures Outcome: Progressing   

## 2020-10-24 NOTE — Plan of Care (Signed)
  Problem: Elimination: Goal: Will not experience complications related to bowel motility Outcome: Adequate for Discharge Goal: Will not experience complications related to urinary retention Outcome: Completed/Met   Problem: Pain Managment: Goal: General experience of comfort will improve Outcome: Progressing   Problem: Safety: Goal: Ability to remain free from injury will improve Outcome: Progressing
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
## 2020-10-25 LAB — CBC
HCT: 23.4 % — ABNORMAL LOW (ref 39.0–52.0)
Hemoglobin: 7.8 g/dL — ABNORMAL LOW (ref 13.0–17.0)
MCH: 30.1 pg (ref 26.0–34.0)
MCHC: 33.3 g/dL (ref 30.0–36.0)
MCV: 90.3 fL (ref 80.0–100.0)
Platelets: 210 10*3/uL (ref 150–400)
RBC: 2.59 MIL/uL — ABNORMAL LOW (ref 4.22–5.81)
RDW: 13.7 % (ref 11.5–15.5)
WBC: 9.2 10*3/uL (ref 4.0–10.5)
nRBC: 0.7 % — ABNORMAL HIGH (ref 0.0–0.2)

## 2020-10-25 LAB — PHOSPHORUS: Phosphorus: 2.9 mg/dL (ref 2.5–4.6)

## 2020-10-25 LAB — BASIC METABOLIC PANEL
Anion gap: 10 (ref 5–15)
BUN: 8 mg/dL (ref 6–20)
CO2: 24 mmol/L (ref 22–32)
Calcium: 7.9 mg/dL — ABNORMAL LOW (ref 8.9–10.3)
Chloride: 104 mmol/L (ref 98–111)
Creatinine, Ser: 0.95 mg/dL (ref 0.61–1.24)
GFR, Estimated: >60 mL/min (ref >60)
Glucose, Bld: 83 mg/dL (ref 70–99)
Potassium: 2.8 mmol/L — ABNORMAL LOW (ref 3.5–5.1)
Sodium: 138 mmol/L (ref 135–145)

## 2020-10-25 LAB — MAGNESIUM
Magnesium: 1.9 mg/dL (ref 1.7–2.4)
Magnesium: 1.9 mg/dL (ref 1.7–2.4)

## 2020-10-25 MED ORDER — POTASSIUM CHLORIDE 10 MEQ/100ML IV SOLN: 10.0000 meq | INTRAVENOUS | Status: DC

## 2020-10-25 MED ORDER — ENOXAPARIN SODIUM 30 MG/0.3ML ~~LOC~~ SOLN
30.0000 mg | Freq: Two times a day (BID) | SUBCUTANEOUS | Status: DC
  Administered 2020-10-25 – 2020-11-03 (×20): 30 mg via SUBCUTANEOUS
  Filled 2020-10-25 (×20): qty 0.3

## 2020-10-25 MED ORDER — POTASSIUM CHLORIDE 10 MEQ/100ML IV SOLN
10.0000 meq | INTRAVENOUS | Status: AC
  Administered 2020-10-25 (×4): 10 meq via INTRAVENOUS
  Filled 2020-10-25 (×4): qty 100

## 2020-10-25 MED ORDER — OXYCODONE HCL 5 MG PO TABS
10.0000 mg | ORAL_TABLET | ORAL | Status: DC | PRN
  Administered 2020-10-25 – 2020-10-29 (×11): 15 mg via ORAL
  Filled 2020-10-25 (×12): qty 3

## 2020-10-25 MED ORDER — OXYCODONE HCL 5 MG PO TABS
5.0000 mg | ORAL_TABLET | ORAL | Status: DC | PRN
  Administered 2020-10-25: 10 mg via ORAL
  Filled 2020-10-25: qty 2

## 2020-10-25 MED ORDER — ACETAMINOPHEN 10 MG/ML IV SOLN
1000.0000 mg | Freq: Four times a day (QID) | INTRAVENOUS | Status: AC
  Administered 2020-10-25 – 2020-10-26 (×4): 1000 mg via INTRAVENOUS
  Filled 2020-10-25 (×4): qty 100

## 2020-10-25 MED ORDER — METHOCARBAMOL 1000 MG/10ML IJ SOLN
1000.0000 mg | Freq: Four times a day (QID) | INTRAVENOUS | Status: DC
  Administered 2020-10-25 – 2020-10-27 (×7): 1000 mg via INTRAVENOUS
  Filled 2020-10-25: qty 10
  Filled 2020-10-25: qty 1000
  Filled 2020-10-25 (×7): qty 10
  Filled 2020-10-25: qty 1000
  Filled 2020-10-25: qty 10

## 2020-10-25 MED ORDER — POTASSIUM CHLORIDE IN NACL 20-0.45 MEQ/L-% IV SOLN
INTRAVENOUS | Status: DC
  Filled 2020-10-25 (×3): qty 1000

## 2020-10-25 NOTE — Progress Notes (Signed)
Patient ID: Jonathan [name], male   DOB: 2001/05/13, 20 y.o.   MRN: [id number] Vital signs are stable Has minimal movement of the left lower extremity.  Stillness is quite painful to stand or move about on it.  No movement distally in the foot either in the dorsiflexors or plantar flexor.  Movement is noted proximally in the iliopsoas.  Trace movement in the quad.

## 2020-10-25 NOTE — Progress Notes (Signed)
Orthopedic Tech Progress Note Patient Details:  Jonathan [name] 14-May-2001 [id number] Had my boss speak with THERAPY about what the patient needed and they want him to be in the [facility]OT for awhile Patient ID: Jonathan [name], male   DOB: 2001/06/24, 20 y.o.   MRN: [id number]   Jonathan [name] 10/25/2020, 5:05 PM
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
## 2020-10-25 NOTE — Progress Notes (Signed)
Inpatient Rehab Admissions Coordinator:   I met with Pt. At bedside for ongoing discussion of potential CIR admission. Pt. States interest and that he will be going home with his roommate and best friend, Jonathan [name]. She plans to take a week off of work to care for him in their apartment and then pt.'s family members will rotate to provide care after that..Pt. is making progress but is not yet medically ready for CIR at this time, but I will follow for potential admit pending bed availability and medical readiness.   Clemens Catholic, [place], [place] Admissions Coordinator  [phone] ([place]) [phone] (office)

## 2020-10-26 LAB — BASIC METABOLIC PANEL
Anion gap: 7 (ref 5–15)
BUN: <5 mg/dL — ABNORMAL LOW (ref 6–20)
CO2: 28 mmol/L (ref 22–32)
Calcium: 8.1 mg/dL — ABNORMAL LOW (ref 8.9–10.3)
Chloride: 104 mmol/L (ref 98–111)
Creatinine, Ser: 0.89 mg/dL (ref 0.61–1.24)
GFR, Estimated: >60 mL/min (ref >60)
Glucose, Bld: 103 mg/dL — ABNORMAL HIGH (ref 70–99)
Potassium: 3.1 mmol/L — ABNORMAL LOW (ref 3.5–5.1)
Sodium: 139 mmol/L (ref 135–145)

## 2020-10-26 LAB — CBC
HCT: 26.2 % — ABNORMAL LOW (ref 39.0–52.0)
Hemoglobin: 8.6 g/dL — ABNORMAL LOW (ref 13.0–17.0)
MCH: 30.4 pg (ref 26.0–34.0)
MCHC: 32.8 g/dL (ref 30.0–36.0)
MCV: 92.6 fL (ref 80.0–100.0)
Platelets: 292 10*3/uL (ref 150–400)
RBC: 2.83 MIL/uL — ABNORMAL LOW (ref 4.22–5.81)
RDW: 14.7 % (ref 11.5–15.5)
WBC: 9.3 10*3/uL (ref 4.0–10.5)
nRBC: 0 % (ref 0.0–0.2)

## 2020-10-26 MED ORDER — POTASSIUM CHLORIDE 10 MEQ/100ML IV SOLN
10.0000 meq | INTRAVENOUS | Status: AC
  Administered 2020-10-26 (×3): 10 meq via INTRAVENOUS
  Filled 2020-10-26 (×3): qty 100

## 2020-10-26 MED ORDER — SODIUM CHLORIDE 0.9% FLUSH
3.0000 mL | Freq: Two times a day (BID) | INTRAVENOUS | Status: DC
  Administered 2020-10-26 – 2020-11-03 (×18): 3 mL via INTRAVENOUS

## 2020-10-26 MED ORDER — ACETAMINOPHEN 10 MG/ML IV SOLN
1000.0000 mg | Freq: Four times a day (QID) | INTRAVENOUS | Status: AC
  Administered 2020-10-26 – 2020-10-27 (×4): 1000 mg via INTRAVENOUS
  Filled 2020-10-26 (×5): qty 100

## 2020-10-26 MED ORDER — SODIUM CHLORIDE 0.9% FLUSH: 3.0000 mL | INTRAVENOUS | Status: DC | PRN

## 2020-10-26 MED ORDER — KETOROLAC TROMETHAMINE 15 MG/ML IJ SOLN
30.0000 mg | Freq: Four times a day (QID) | INTRAMUSCULAR | Status: AC
  Administered 2020-10-26 – 2020-10-31 (×20): 30 mg via INTRAVENOUS
  Filled 2020-10-26 (×21): qty 2

## 2020-10-26 MED ORDER — SODIUM CHLORIDE 0.9 % IV SOLN: 250.0000 mL | INTRAVENOUS | Status: DC | PRN
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
## 2020-10-26 NOTE — Progress Notes (Signed)
Inpatient Rehab Admissions Coordinator:   Pt. Is not medically ready for CIR at this time, though appears to be improving began clear liquid diet today. I will continue to follow for potential admit to CIR pending medical readiness and bed availability.   Megan Salon, MS, CCC-SLP Rehab Admissions Coordinator  [phone] (celll) [phone] (office)

## 2020-10-26 NOTE — Plan of Care (Signed)
  Problem: Education: Goal: Knowledge of General Education information will improve Description: Including pain rating scale, medication(s)/side effects and non-pharmacologic comfort measures Outcome: Progressing   Problem: Clinical Measurements: Goal: Will remain free from infection Outcome: Progressing   

## 2020-10-26 NOTE — Progress Notes (Signed)
Patient ID: Jonathan [name], male   DOB: Dec 05, 2000, 20 y.o.   MRN: [id number] Vital signs stable Neurologic exam remains unchanged with weakness distally in the quadriceps tibialis anterior and gastrocs.  Patient notes some changing in sensation.  Will likely need extensive rehab with his left lower extremity.

## 2020-10-27 LAB — BASIC METABOLIC PANEL
Anion gap: 10 (ref 5–15)
BUN: 9 mg/dL (ref 6–20)
CO2: 23 mmol/L (ref 22–32)
Calcium: 8.2 mg/dL — ABNORMAL LOW (ref 8.9–10.3)
Chloride: 102 mmol/L (ref 98–111)
Creatinine, Ser: 0.85 mg/dL (ref 0.61–1.24)
GFR, Estimated: >60 mL/min (ref >60)
Glucose, Bld: 94 mg/dL (ref 70–99)
Potassium: 3.2 mmol/L — ABNORMAL LOW (ref 3.5–5.1)
Sodium: 135 mmol/L (ref 135–145)

## 2020-10-27 LAB — CBC
HCT: 25.8 % — ABNORMAL LOW (ref 39.0–52.0)
Hemoglobin: 8.4 g/dL — ABNORMAL LOW (ref 13.0–17.0)
MCH: 30.3 pg (ref 26.0–34.0)
MCHC: 32.6 g/dL (ref 30.0–36.0)
MCV: 93.1 fL (ref 80.0–100.0)
Platelets: 345 10*3/uL (ref 150–400)
RBC: 2.77 MIL/uL — ABNORMAL LOW (ref 4.22–5.81)
RDW: 14.9 % (ref 11.5–15.5)
WBC: 9 10*3/uL (ref 4.0–10.5)
nRBC: 0.2 % (ref 0.0–0.2)

## 2020-10-27 MED ORDER — GABAPENTIN 400 MG PO CAPS
400.0000 mg | ORAL_CAPSULE | Freq: Three times a day (TID) | ORAL | Status: DC
  Administered 2020-10-27 – 2020-11-04 (×24): 400 mg via ORAL
  Filled 2020-10-27 (×24): qty 1

## 2020-10-27 MED ORDER — POTASSIUM CHLORIDE 10 MEQ/100ML IV SOLN
10.0000 meq | INTRAVENOUS | Status: AC
  Administered 2020-10-27 (×3): 10 meq via INTRAVENOUS
  Filled 2020-10-27 (×3): qty 100

## 2020-10-27 MED ORDER — HYDROMORPHONE HCL 1 MG/ML IJ SOLN
0.5000 mg | INTRAMUSCULAR | Status: DC | PRN
  Administered 2020-10-27 – 2020-10-29 (×9): 1 mg via INTRAVENOUS
  Filled 2020-10-27 (×9): qty 1

## 2020-10-27 MED ORDER — METHOCARBAMOL 500 MG PO TABS
1000.0000 mg | ORAL_TABLET | Freq: Four times a day (QID) | ORAL | Status: DC
  Administered 2020-10-27 – 2020-11-04 (×31): 1000 mg via ORAL
  Filled 2020-10-27 (×31): qty 2

## 2020-10-27 MED ORDER — ACETAMINOPHEN 500 MG PO TABS
1000.0000 mg | ORAL_TABLET | Freq: Three times a day (TID) | ORAL | Status: DC
  Administered 2020-10-27 – 2020-11-02 (×17): 1000 mg via ORAL
  Filled 2020-10-27 (×19): qty 2

## 2020-10-27 NOTE — Progress Notes (Signed)
IP rehab admissions - noted patient to start on full liquids today.  Currently rehab beds are full.  I will have my partner f/u tomorrow for medical readiness and bed availability.  Call for questions.  [phone]
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
## 2020-10-27 NOTE — Consult Note (Signed)
WOC Nurse ostomy follow up Pouch change and teaching session this AM.  Patient is participative. We discuss upcoming plans for CIR once stable and that teaching will continue.  I am encouraged he will take the lead and manage self care for this ostomy. He is engaged and asks appropriate questions.  We discuss twice weekly pouch changes, emptying when 1/3 full.  He agrees to assist staff with emptying so he can get more comfortable with this.  Stoma type/location: LUQ colostomy Stomal assessment/size: 1 3/4" Peristomal assessment: pink patent and producing soft Schappert stool.  Well budded stoma Treatment options for stomal/peristomal skin: barrier ring and 1 piece flat pouch Output soft Hoyt stool Ostomy pouching: 1pc.flat with barrier ring Education provided: Pouch change performed.  Patient cuts barrier opening, applied barrier ring.  Pouch applied and rolled closed.  Agrees to assist with pouch emptying and call staff for help. Will change pouch again Monday.  Enrolled patient in [place] Secure Start Discharge program: No.  Will Monday-closer to discharge to CIR  Will follow. Jonathan Hudson MSN, RN, FNP-BC CWON Wound, Ostomy, Continence Nurse Pager [phone]
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
## 2020-10-28 LAB — MAGNESIUM: Magnesium: 1.9 mg/dL (ref 1.7–2.4)

## 2020-10-28 LAB — CBC
HCT: 24.6 % — ABNORMAL LOW (ref 39.0–52.0)
Hemoglobin: 7.8 g/dL — ABNORMAL LOW (ref 13.0–17.0)
MCH: 30.5 pg (ref 26.0–34.0)
MCHC: 31.7 g/dL (ref 30.0–36.0)
MCV: 96.1 fL (ref 80.0–100.0)
Platelets: 543 10*3/uL — ABNORMAL HIGH (ref 150–400)
RBC: 2.56 MIL/uL — ABNORMAL LOW (ref 4.22–5.81)
RDW: 15.7 % — ABNORMAL HIGH (ref 11.5–15.5)
WBC: 11.4 10*3/uL — ABNORMAL HIGH (ref 4.0–10.5)
nRBC: 0 % (ref 0.0–0.2)

## 2020-10-28 LAB — BASIC METABOLIC PANEL
Anion gap: 12 (ref 5–15)
BUN: 11 mg/dL (ref 6–20)
CO2: 23 mmol/L (ref 22–32)
Calcium: 8.5 mg/dL — ABNORMAL LOW (ref 8.9–10.3)
Chloride: 102 mmol/L (ref 98–111)
Creatinine, Ser: 0.95 mg/dL (ref 0.61–1.24)
GFR, Estimated: >60 mL/min (ref >60)
Glucose, Bld: 83 mg/dL (ref 70–99)
Potassium: 3.3 mmol/L — ABNORMAL LOW (ref 3.5–5.1)
Sodium: 137 mmol/L (ref 135–145)

## 2020-10-28 MED ORDER — LEVETIRACETAM 500 MG PO TABS
500.0000 mg | ORAL_TABLET | Freq: Two times a day (BID) | ORAL | Status: DC
  Administered 2020-10-28 – 2020-11-04 (×15): 500 mg via ORAL
  Filled 2020-10-28 (×15): qty 1

## 2020-10-28 MED ORDER — ENSURE ENLIVE PO LIQD
237.0000 mL | Freq: Two times a day (BID) | ORAL | Status: DC
  Administered 2020-10-29 – 2020-10-31 (×3): 237 mL via ORAL

## 2020-10-28 MED ORDER — SIMETHICONE 80 MG PO CHEW
80.0000 mg | CHEWABLE_TABLET | Freq: Four times a day (QID) | ORAL | Status: DC | PRN
  Administered 2020-10-28 – 2020-10-30 (×2): 80 mg via ORAL
  Filled 2020-10-28 (×2): qty 1

## 2020-10-28 MED ORDER — DIPHENHYDRAMINE HCL 25 MG PO CAPS
25.0000 mg | ORAL_CAPSULE | Freq: Every evening | ORAL | Status: DC | PRN
  Administered 2020-10-28 – 2020-11-03 (×2): 25 mg via ORAL
  Filled 2020-10-28 (×2): qty 1

## 2020-10-28 MED ORDER — MAGNESIUM SULFATE IN D5W 1-5 GM/100ML-% IV SOLN
1.0000 g | Freq: Once | INTRAVENOUS | Status: AC
  Administered 2020-10-28: 1 g via INTRAVENOUS
  Filled 2020-10-28 (×2): qty 100

## 2020-10-28 MED ORDER — SODIUM CHLORIDE 0.9 % IV BOLUS
500.0000 mL | Freq: Once | INTRAVENOUS | Status: AC
  Administered 2020-10-28: 500 mL via INTRAVENOUS

## 2020-10-28 MED ORDER — POTASSIUM CHLORIDE CRYS ER 20 MEQ PO TBCR
40.0000 meq | EXTENDED_RELEASE_TABLET | Freq: Two times a day (BID) | ORAL | Status: AC
  Administered 2020-10-28 (×2): 40 meq via ORAL
  Filled 2020-10-28 (×2): qty 2
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
## 2020-10-28 NOTE — Plan of Care (Signed)
  Problem: Education: Goal: Knowledge of General Education information will improve Description: Including pain rating scale, medication(s)/side effects and non-pharmacologic comfort measures Outcome: Progressing   Problem: Health Behavior/Discharge Planning: Goal: Ability to manage health-related needs will improve Outcome: Progressing   Problem: Clinical Measurements: Goal: Ability to maintain clinical measurements within normal limits will improve Outcome: Progressing Goal: Will remain free from infection Outcome: Completed/Met   Problem: Activity: Goal: Risk for activity intolerance will decrease Outcome: Progressing
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
## 2020-10-28 NOTE — Progress Notes (Signed)
Inpatient Rehab Admissions Coordinator:   Pt. Is not medically ready for d/c to CIR at this time, But I will continue to follow for potential admit one medically ready.  Megan Salon, MS, CCC-SLP Rehab Admissions Coordinator  [phone] (celll) [phone] (office)

## 2020-10-29 LAB — BASIC METABOLIC PANEL
Anion gap: 9 (ref 5–15)
BUN: 8 mg/dL (ref 6–20)
CO2: 25 mmol/L (ref 22–32)
Calcium: 8.5 mg/dL — ABNORMAL LOW (ref 8.9–10.3)
Chloride: 104 mmol/L (ref 98–111)
Creatinine, Ser: 0.89 mg/dL (ref 0.61–1.24)
GFR, Estimated: >60 mL/min (ref >60)
Glucose, Bld: 97 mg/dL (ref 70–99)
Potassium: 4 mmol/L (ref 3.5–5.1)
Sodium: 138 mmol/L (ref 135–145)

## 2020-10-29 LAB — CBC
HCT: 27.7 % — ABNORMAL LOW (ref 39.0–52.0)
Hemoglobin: 8.8 g/dL — ABNORMAL LOW (ref 13.0–17.0)
MCH: 29.9 pg (ref 26.0–34.0)
MCHC: 31.8 g/dL (ref 30.0–36.0)
MCV: 94.2 fL (ref 80.0–100.0)
Platelets: 539 10*3/uL — ABNORMAL HIGH (ref 150–400)
RBC: 2.94 MIL/uL — ABNORMAL LOW (ref 4.22–5.81)
RDW: 15.9 % — ABNORMAL HIGH (ref 11.5–15.5)
WBC: 10.6 10*3/uL — ABNORMAL HIGH (ref 4.0–10.5)
nRBC: 0 % (ref 0.0–0.2)

## 2020-10-29 MED ORDER — OXYCODONE HCL 5 MG PO TABS
10.0000 mg | ORAL_TABLET | ORAL | Status: DC | PRN
Start: 2020-10-29 — End: 2020-11-04
  Administered 2020-10-29 – 2020-11-04 (×26): 10 mg via ORAL
  Filled 2020-10-29 (×27): qty 2

## 2020-10-29 MED ORDER — HYDROMORPHONE HCL 1 MG/ML IJ SOLN
0.5000 mg | INTRAMUSCULAR | Status: DC | PRN
  Administered 2020-10-29 – 2020-11-02 (×19): 0.5 mg via INTRAVENOUS
  Filled 2020-10-29 (×19): qty 1
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
## 2020-10-29 NOTE — Plan of Care (Signed)
  Problem: Education: Goal: Knowledge of General Education information will improve Description Including pain rating scale, medication(s)/side effects and non-pharmacologic comfort measures Outcome: Progressing   

## 2020-10-30 ENCOUNTER — Inpatient Hospital Stay (HOSPITAL_COMMUNITY): Payer: Self-pay

## 2020-10-30 MED ORDER — METOPROLOL TARTRATE 5 MG/5ML IV SOLN
5.0000 mg | Freq: Once | INTRAVENOUS | Status: AC
  Administered 2020-10-30: 5 mg via INTRAVENOUS
  Filled 2020-10-30: qty 5

## 2020-10-30 MED ORDER — IOHEXOL 350 MG/ML SOLN
75.0000 mL | Freq: Once | INTRAVENOUS | Status: AC | PRN
  Administered 2020-10-30: 75 mL via INTRAVENOUS
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
## 2020-10-30 NOTE — Progress Notes (Signed)
   10/30/20 1927  Assess: MEWS Score  Temp 97.6 F (36.4 C)  BP (!) 158/98  Pulse Rate (!) 134  ECG Heart Rate (!) 132  Resp (!) 28  Level of Consciousness Alert  SpO2 100 %  O2 Device Room Air  Assess: MEWS Score  MEWS Temp 0  MEWS Systolic 0  MEWS Pulse 3  MEWS RR 2  MEWS LOC 0  MEWS Score 5  MEWS Score Color Red  Assess: if the MEWS score is Yellow or Red  Were vital signs taken at a resting state? Yes  Focused Assessment No change from prior assessment  Early Detection of Sepsis Score *See Row Information* Low  MEWS guidelines implemented *See Row Information* Yes  Treat  Neuro symptoms relieved by Rest;Anti-anxiety medication  Take Vital Signs  Increase Vital Sign Frequency  Red: Q 1hr X 4 then Q 4hr X 4, if remains red, continue Q 4hrs  Escalate  MEWS: Escalate Red: discuss with charge nurse/RN and provider, consider discussing with RRT  Notify: Charge Nurse/RN  Name of Charge Nurse/RN Notified Gladys, RN  Date Charge Nurse/RN Notified 10/30/20  Time Charge Nurse/RN Notified 2010  Document  Patient Outcome Stabilized after interventions  Progress note created (see row info) Yes

## 2020-10-30 NOTE — Progress Notes (Signed)
   10/30/20 2038  Assess: MEWS Score  Temp 97.6 F (36.4 C)  BP (!) 154/91  Pulse Rate (!) 138  ECG Heart Rate (!) 135  Resp (!) 22  Level of Consciousness Alert  SpO2 95 %  O2 Device Room Air  Assess: MEWS Score  MEWS Temp 0  MEWS Systolic 0  MEWS Pulse 3  MEWS RR 1  MEWS LOC 0  MEWS Score 4  MEWS Score Color Red  Assess: if the MEWS score is Yellow or Red  Were vital signs taken at a resting state? Yes  Focused Assessment No change from prior assessment  Early Detection of Sepsis Score *See Row Information* Low  MEWS guidelines implemented *See Row Information* Yes  Take Vital Signs  Increase Vital Sign Frequency  Red: Q 1hr X 4 then Q 4hr X 4, if remains red, continue Q 4hrs  Escalate  MEWS: Escalate Red: discuss with charge nurse/RN and provider, consider discussing with RRT  Notify: Charge Nurse/RN  Name of Charge Nurse/RN Notified Gladys, RN  Date Charge Nurse/RN Notified 10/30/20  Time Charge Nurse/RN Notified 2056

## 2020-10-30 NOTE — Plan of Care (Signed)
  Problem: Education: Goal: Knowledge of General Education information will improve Description: Including pain rating scale, medication(s)/side effects and non-pharmacologic comfort measures 10/30/2020 0615 by Marylynn Pearson, Sylena Lotter Caryn Section, RN Outcome: Progressing 10/29/2020 1955 by Marylynn Pearson, Tatanisha Cuthbert Caryn Section, RN Outcome: Progressing   Problem: Health Behavior/Discharge Planning: Goal: Ability to manage health-related needs will improve Outcome: Progressing   Problem: Clinical Measurements: Goal: Ability to maintain clinical measurements within normal limits will improve Outcome: Progressing

## 2020-10-30 NOTE — Progress Notes (Signed)
   10/30/20 1938  Assess: MEWS Score  Temp 97.6 F (36.4 C)  BP (!) 163/94  Pulse Rate (!) 138  ECG Heart Rate (!) 138  Resp 19  Level of Consciousness Alert  SpO2 97 %  O2 Device Room Air  Assess: MEWS Score  MEWS Temp 0  MEWS Systolic 0  MEWS Pulse 3  MEWS RR 0  MEWS LOC 0  MEWS Score 3  MEWS Score Color Yellow  Assess: if the MEWS score is Yellow or Red  Were vital signs taken at a resting state? Yes  Focused Assessment No change from prior assessment  Early Detection of Sepsis Score *See Row Information* Low  MEWS guidelines implemented *See Row Information* Yes

## 2020-10-31 ENCOUNTER — Inpatient Hospital Stay (HOSPITAL_COMMUNITY): Payer: Self-pay

## 2020-10-31 LAB — BASIC METABOLIC PANEL
Anion gap: 9 (ref 5–15)
BUN: 11 mg/dL (ref 6–20)
CO2: 25 mmol/L (ref 22–32)
Calcium: 8.8 mg/dL — ABNORMAL LOW (ref 8.9–10.3)
Chloride: 98 mmol/L (ref 98–111)
Creatinine, Ser: 0.96 mg/dL (ref 0.61–1.24)
GFR, Estimated: >60 mL/min (ref >60)
Glucose, Bld: 118 mg/dL — ABNORMAL HIGH (ref 70–99)
Potassium: 4 mmol/L (ref 3.5–5.1)
Sodium: 132 mmol/L — ABNORMAL LOW (ref 135–145)

## 2020-10-31 LAB — URINALYSIS, ROUTINE W REFLEX MICROSCOPIC
Bacteria, UA: NONE SEEN
Glucose, UA: NEGATIVE mg/dL
Ketones, ur: 20 mg/dL — AB
Leukocytes,Ua: NEGATIVE
Nitrite: NEGATIVE
Protein, ur: 30 mg/dL — AB
Specific Gravity, Urine: 1.042 — ABNORMAL HIGH (ref 1.005–1.030)
pH: 5 (ref 5.0–8.0)

## 2020-10-31 LAB — CBC WITH DIFFERENTIAL/PLATELET
Abs Immature Granulocytes: 0.1 10*3/uL — ABNORMAL HIGH (ref 0.00–0.07)
Basophils Absolute: 0 10*3/uL (ref 0.0–0.1)
Basophils Relative: 0 %
Eosinophils Absolute: 0 10*3/uL (ref 0.0–0.5)
Eosinophils Relative: 0 %
HCT: 28.7 % — ABNORMAL LOW (ref 39.0–52.0)
Hemoglobin: 9.3 g/dL — ABNORMAL LOW (ref 13.0–17.0)
Immature Granulocytes: 1 %
Lymphocytes Relative: 4 %
Lymphs Abs: 0.6 10*3/uL — ABNORMAL LOW (ref 0.7–4.0)
MCH: 30.1 pg (ref 26.0–34.0)
MCHC: 32.4 g/dL (ref 30.0–36.0)
MCV: 92.9 fL (ref 80.0–100.0)
Monocytes Absolute: 1.1 10*3/uL — ABNORMAL HIGH (ref 0.1–1.0)
Monocytes Relative: 7 %
Neutro Abs: 14.3 10*3/uL — ABNORMAL HIGH (ref 1.7–7.7)
Neutrophils Relative %: 88 %
Platelets: 617 10*3/uL — ABNORMAL HIGH (ref 150–400)
RBC: 3.09 MIL/uL — ABNORMAL LOW (ref 4.22–5.81)
RDW: 15.8 % — ABNORMAL HIGH (ref 11.5–15.5)
WBC: 16.1 10*3/uL — ABNORMAL HIGH (ref 4.0–10.5)
nRBC: 0 % (ref 0.0–0.2)

## 2020-10-31 LAB — MAGNESIUM: Magnesium: 1.9 mg/dL (ref 1.7–2.4)

## 2020-10-31 LAB — PHOSPHORUS: Phosphorus: 4.1 mg/dL (ref 2.5–4.6)

## 2020-10-31 MED ORDER — IOHEXOL 9 MG/ML PO SOLN
ORAL | Status: AC
  Administered 2020-10-31: 500 mL
  Filled 2020-10-31: qty 1000

## 2020-10-31 MED ORDER — IBUPROFEN 200 MG PO TABS
600.0000 mg | ORAL_TABLET | Freq: Four times a day (QID) | ORAL | Status: DC | PRN
  Administered 2020-10-31 – 2020-11-04 (×8): 600 mg via ORAL
  Filled 2020-10-31 (×8): qty 3

## 2020-10-31 MED ORDER — IOHEXOL 300 MG/ML  SOLN
100.0000 mL | Freq: Once | INTRAMUSCULAR | Status: AC | PRN
  Administered 2020-10-31: 100 mL via INTRAVENOUS

## 2020-10-31 NOTE — Progress Notes (Signed)
   10/31/20 2012  Assess: MEWS Score  Temp (!) 100.6 F (38.1 C)  BP (!) 145/87  Pulse Rate (!) 141  ECG Heart Rate (!) 140  Resp (!) 24  Level of Consciousness Alert  SpO2 94 %  O2 Device Room Air  Assess: MEWS Score  MEWS Temp 1  MEWS Systolic 0  MEWS Pulse 3  MEWS RR 1  MEWS LOC 0  MEWS Score 5  MEWS Score Color Red  Assess: if the MEWS score is Yellow or Red  Were vital signs taken at a resting state? Yes  Focused Assessment No change from prior assessment  Early Detection of Sepsis Score *See Row Information* Low  MEWS guidelines implemented *See Row Information* Yes  Take Vital Signs  Increase Vital Sign Frequency  Red: Q 1hr X 4 then Q 4hr X 4, if remains red, continue Q 4hrs  Escalate  MEWS: Escalate Red: discuss with charge nurse/RN and provider, consider discussing with RRT  Notify: Charge Nurse/RN  Name of Charge Nurse/RN Notified Gladys, RN  Date Charge Nurse/RN Notified 10/31/20  Time Charge Nurse/RN Notified 2024  Document  Patient Outcome Other (Comment) (no intervention at this time)  Progress note created (see row info) Yes
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
## 2020-10-31 NOTE — Progress Notes (Signed)
   10/31/20 0103  Assess: MEWS Score  Temp 97.8 F (36.6 C)  BP 119/83  Pulse Rate (!) 131  ECG Heart Rate (!) 132  Resp (!) 26  SpO2 94 %  O2 Device Room Air  Assess: MEWS Score  MEWS Temp 0  MEWS Systolic 0  MEWS Pulse 3  MEWS RR 2  MEWS LOC 0  MEWS Score 5  MEWS Score Color Red  Assess: if the MEWS score is Yellow or Red  Were vital signs taken at a resting state? Yes  Focused Assessment No change from prior assessment  Early Detection of Sepsis Score *See Row Information* Low  MEWS guidelines implemented *See Row Information* Yes  Treat  MEWS Interventions Other (Comment) (no new orders given at this time)  Take Vital Signs  Increase Vital Sign Frequency  Red: Q 1hr X 4 then Q 4hr X 4, if remains red, continue Q 4hrs  Escalate  MEWS: Escalate Red: discuss with charge nurse/RN and provider, consider discussing with RRT  Notify: Charge Nurse/RN  Name of Charge Nurse/RN Notified Gladys, RN  Date Charge Nurse/RN Notified 10/31/20  Time Charge Nurse/RN Notified [phone]

## 2020-10-31 NOTE — Progress Notes (Signed)
Inpatient Rehabilitation Admissions Coordinator  I met with patient at bedside. H states he prefers to discharge directly home. He doe not want to pursue CIR admit. We will sign off at this time.  Danne Baxter, RN, MSN Rehab Admissions Coordinator [phone] 10/31/2020 12:11 PM

## 2020-10-31 NOTE — Progress Notes (Signed)
Paged on call, Dr. Cliffton Asters, about pt developing fever and elevated HR.

## 2020-10-31 NOTE — Progress Notes (Signed)
RN collecting urine sample from patient and notices blood stain on cloth chuck.  RN rolls pt over and pt is actively bleeding from inside butt cheeks; pt was cleansed and barrier cream applied to inside of butt cheeks; gauze was  Placed in between cheeks and fresh clean cloth placed underneath pt.  RN will continue to monitor blood loss from pt.

## 2020-10-31 NOTE — Progress Notes (Signed)
PT Cancellation Note  Patient Details Name: Jonathan [name] MRN: [id number] DOB: 2000-08-07   Cancelled Treatment:    Reason Eval/Treat Not Completed: Medical issues which prohibited therapy today. Per discussion with RN and chart review, pt with some new bleeding, concern for intra-abdominal abscess, and plan for CT scan this afternoon. RN asked therapies to hold until tomorrow. Pt also noted to have HR sustaining 130-140s while at rest at this time. PT will continue to follow and continue mobility progression as able.   Rolm Baptise, PT, DPT   Acute Rehabilitation Department Pager #: [phone]   Gaetana Michaelis 10/31/2020, 3:45 PM

## 2020-11-01 ENCOUNTER — Inpatient Hospital Stay (HOSPITAL_COMMUNITY): Payer: Self-pay

## 2020-11-01 LAB — CBC
HCT: 28.2 % — ABNORMAL LOW (ref 39.0–52.0)
Hemoglobin: 9 g/dL — ABNORMAL LOW (ref 13.0–17.0)
MCH: 29.6 pg (ref 26.0–34.0)
MCHC: 31.9 g/dL (ref 30.0–36.0)
MCV: 92.8 fL (ref 80.0–100.0)
Platelets: 586 10*3/uL — ABNORMAL HIGH (ref 150–400)
RBC: 3.04 MIL/uL — ABNORMAL LOW (ref 4.22–5.81)
RDW: 15.2 % (ref 11.5–15.5)
WBC: 13.3 10*3/uL — ABNORMAL HIGH (ref 4.0–10.5)
nRBC: 0 % (ref 0.0–0.2)

## 2020-11-01 LAB — BASIC METABOLIC PANEL
Anion gap: 9 (ref 5–15)
BUN: 12 mg/dL (ref 6–20)
CO2: 26 mmol/L (ref 22–32)
Calcium: 8.6 mg/dL — ABNORMAL LOW (ref 8.9–10.3)
Chloride: 96 mmol/L — ABNORMAL LOW (ref 98–111)
Creatinine, Ser: 1.05 mg/dL (ref 0.61–1.24)
GFR, Estimated: >60 mL/min (ref >60)
Glucose, Bld: 119 mg/dL — ABNORMAL HIGH (ref 70–99)
Potassium: 4 mmol/L (ref 3.5–5.1)
Sodium: 131 mmol/L — ABNORMAL LOW (ref 135–145)

## 2020-11-01 MED ORDER — SODIUM CHLORIDE 1 G PO TABS
1.0000 g | ORAL_TABLET | Freq: Once | ORAL | Status: AC
  Administered 2020-11-01: 1 g via ORAL
  Filled 2020-11-01: qty 1
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
## 2020-11-01 NOTE — TOC Progression Note (Addendum)
Transition of Care [facility]) - Progression Note    Patient Details  Name: LANNY LIPKIN MRN: [id number] Date of Birth: 03/04/2001  Transition of [facility] CM/SW Contact  Ella Bodo, RN Phone Number: 11/01/2020, 1210 Clinical Narrative:   Patient is refusing rehab admission; wants to go home.  Met with patient and his grandmother to discuss arrangements.  Patient states he will discharge home with his roommate and family members to assist with 24-hour care.  Referral to [facility] for home health RN, PT, and OT. Patient is uninsured, but has been accepted by charity home health agency.  Will need discharge prescriptions sent to [place] pharmacy to be filled using [place] letter.  Will arrange DME as recommended by PT/OT.    Expected Discharge Plan: [place] Barriers to Discharge: Continued Medical Work up  Expected Discharge Plan and Services Expected Discharge Plan: [place]   Discharge Planning Services: CM Consult Post Acute Care Choice: [place] arrangements for the past 2 months: Apartment                           HH Arranged: RN,PT,OT [place]: [place]   Time [place]: [phone] Representative spoke with at [place]: [place] (SDOH) Interventions    Readmission Risk Interventions No flowsheet data found.  Reinaldo Raddle, RN, BSN  Trauma/Neuro ICU Case Manager [phone]
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
## 2020-11-02 ENCOUNTER — Inpatient Hospital Stay (HOSPITAL_COMMUNITY): Payer: Self-pay

## 2020-11-02 DIAGNOSIS — R609 Edema, unspecified: Secondary | ICD-10-CM

## 2020-11-02 LAB — BASIC METABOLIC PANEL
Anion gap: 11 (ref 5–15)
BUN: 12 mg/dL (ref 6–20)
CO2: 27 mmol/L (ref 22–32)
Calcium: 8.9 mg/dL (ref 8.9–10.3)
Chloride: 95 mmol/L — ABNORMAL LOW (ref 98–111)
Creatinine, Ser: 0.92 mg/dL (ref 0.61–1.24)
GFR, Estimated: >60 mL/min (ref >60)
Glucose, Bld: 111 mg/dL — ABNORMAL HIGH (ref 70–99)
Potassium: 4.4 mmol/L (ref 3.5–5.1)
Sodium: 133 mmol/L — ABNORMAL LOW (ref 135–145)

## 2020-11-02 LAB — CBC
HCT: 30.1 % — ABNORMAL LOW (ref 39.0–52.0)
Hemoglobin: 9.5 g/dL — ABNORMAL LOW (ref 13.0–17.0)
MCH: 29.7 pg (ref 26.0–34.0)
MCHC: 31.6 g/dL (ref 30.0–36.0)
MCV: 94.1 fL (ref 80.0–100.0)
Platelets: 670 10*3/uL — ABNORMAL HIGH (ref 150–400)
RBC: 3.2 MIL/uL — ABNORMAL LOW (ref 4.22–5.81)
RDW: 15.3 % (ref 11.5–15.5)
WBC: 10.3 10*3/uL (ref 4.0–10.5)
nRBC: 0 % (ref 0.0–0.2)

## 2020-11-02 MED ORDER — HYDROMORPHONE HCL 1 MG/ML IJ SOLN
0.5000 mg | Freq: Four times a day (QID) | INTRAMUSCULAR | Status: DC | PRN
  Administered 2020-11-02 – 2020-11-03 (×5): 0.5 mg via INTRAVENOUS
  Filled 2020-11-02 (×5): qty 1

## 2020-11-02 MED ORDER — METOPROLOL TARTRATE 12.5 MG HALF TABLET
12.5000 mg | ORAL_TABLET | Freq: Two times a day (BID) | ORAL | Status: DC
  Administered 2020-11-02 – 2020-11-03 (×3): 12.5 mg via ORAL
  Filled 2020-11-02 (×3): qty 1

## 2020-11-02 MED ORDER — ACETAMINOPHEN 500 MG PO TABS
1000.0000 mg | ORAL_TABLET | Freq: Four times a day (QID) | ORAL | Status: DC
  Administered 2020-11-02 – 2020-11-04 (×8): 1000 mg via ORAL
  Filled 2020-11-02 (×8): qty 2

## 2020-11-02 NOTE — Progress Notes (Signed)
Patient ID: Jonathan [name], male   DOB: 2000/09/19, 20 y.o.   MRN: [id number] Leg strength continues to improve on the left side.  Still no dorsi or plantar flexor strength.  This may come in time.  At this point I will sign off and follow him up on an outpatient basis about 3 or 4 weeks down the road.  Hopefully he will be out of the hospital then please call if there are other acute concerns.

## 2020-11-02 NOTE — Progress Notes (Signed)
Notified and spoke to Dr. Derrell Lolling . The JP drain not holding a charge upon assessment, and second RN verified. Noted to be a small tear in tubing at the insertion site, but is still in place and has not been pulled out. Due to low output , leave JP drain in place and re-evaluate in morning. Continuing to monitor.

## 2020-11-02 NOTE — Consult Note (Signed)
WOC Nurse ostomy follow up POuch was changed yesterday and patient did not wish to change again. After some gentle discussion, he agrees to perform pouch change for me so I can sign him off as competent in self care of the ostomy. His roommate at the bedside.  He agrees to have her observe as she will be a second set of eyes and hands once home.   Stoma type/location: LUQ colostomy Stomal assessment/size: Stoma size decreased to 1 1/4" (40mm) and new pattern is cut today.  I demonstrate the measuring guide and to re-measure at each pouch change as stoma may change size for up to 6 weeks. Stoma is pink and well budded. Producing dark green stool.  Peristomal assessment: intact Treatment options for stomal/peristomal skin: barrier ring and flat one piece pouch.  Output dark green stool Ostomy pouching: 1pc.pouch (flat) with barrier ring.  Education provided: Patient performs pouch change. First attempt at cutting barrier, he cuts through the barrier.  He begins again and cuts opening successfully.  I explain this will be easier at home standing in front of a mirror. He agrees.  He applies barrier ring and applies his pouch.  Pouch opening points down today and we demonstrate how to sit on toilet and empty bag.  He demonstrates roll open and close and cleaning lip of pouch.  He feels confident and roommate feels she can be standby assist.  We discuss twice weekly pouch changes. Showering with or without pouch (once cleared by surgery) and emptying when 1/3 full.  Enrolled patient in [place] Secure Start Discharge program: Yes I will today. He is discharging to an apartment in [place], not his address on file. This was provided.  I am requesting filtered pouches as intestinal gas has been bothersome. I am providing him with another packet of information. He is unsure where his materials are at this time.  Will follow.  [place] MSN, RN, FNP-BC CWON Wound, Ostomy, Continence Nurse Pager  [phone]
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
## 2020-11-02 NOTE — Progress Notes (Signed)
Lower extremity venous has been completed.   Preliminary results in CV Proc.   Blanch Media 11/02/2020 9:40 AM

## 2020-11-03 MED ORDER — METOPROLOL TARTRATE 25 MG PO TABS
25.0000 mg | ORAL_TABLET | Freq: Two times a day (BID) | ORAL | Status: DC
  Administered 2020-11-03 – 2020-11-04 (×2): 25 mg via ORAL
  Filled 2020-11-03 (×2): qty 1

## 2020-11-03 NOTE — Discharge Instructions (Signed)
Ostomy Support Information  You've heard that people get along just fine with only one of their eyes, or one of their lungs, or one of their kidneys. 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   YOU ARE NOT ALONE.  There are nearly three quarters of a million people in the [place] who have an ostomy; people who have had surgery to remove all or part of their colons or bladders.   There is even a national association, the [place] [employer] [place] with over 350 local affiliated support groups that are organized by volunteers who provide peer support and counseling. [employer] has a toll free telephone num-ber, [phone] and an educational, interactive website, www.ostomy.org   An ostomy is an opening in the belly (abdominal wall) made by surgery. Ostomates are people who have had this procedure. The opening (stoma) allows the kidney or bowel to discharge waste. An external pouch covers the stoma to collect waste. Pouches are are a simple bag and are odor free. Different companies have disposable or reusable pouches to fit one's lifestyle. An ostomy can either be temporary or permanent.   THERE ARE THREE MAIN TYPES OF OSTOMIES  Colostomy. A colostomy is a surgically created opening in the large intestine (colon).  Ileostomy. An ileostomy is a surgically created opening in the small intestine.  Urostomy. A urostomy is a surgically created opening to divert urine away from the bladder.  OSTOMY Care  The following guidelines will make care of your colostomy easier. Keep this information close by for quick reference.  Helpful DIET hints Eat a well-balanced diet including vegetables and fresh fruits. Eat on a regular schedule.  Drink at least 6 to  8 glasses of fluids daily. Eat slowly in a relaxed atmosphere. Chew your food thoroughly. Avoid chewing gum, smoking, and drinking from a straw. This will help decrease the amount of air you swallow, which may help reduce gas. Eating yogurt or drinking buttermilk may help reduce gas.  To control gas at night, do not eat after 8 p.m. This will give your bowel time to quiet down before you go to bed.  If gas is a problem, you can purchase Beano. Sprinkle Beano on the first bite of food before eating to reduce gas. It has no flavor and should not change the taste of your food. You can buy Beano over the counter at your local drugstore.  Foods like fish, onions, garlic, broccoli, asparagus, and cabbage produce odor. Although your pouch is odor-proof, if you eat these foods you may notice a stronger odor when emptying your pouch. If this is a concern, you may want to limit these foods in your diet.  If you have an ileostomy, you will have chronic diarrhea & need to drink more liquids to avoid getting dehydrated.  Consider antidiarrheal medicine like imodium (loperamide) or Lomotil to help slow down bowel movements / diarrhea into your ileostomy bag.   Tips for POUCHING an OSTOMY   Changing Your Pouch The best time to change your pouch is in the morning, before eating or drinking anything. Your stoma can function at any time, but it will function more after eating or drinking.   Applying the pouching system  Place all your equipment close at hand  before removing your pouch.  Wash your hands.  Stand or sit in front of a mirror. Use the position that works best for you. Remember that you must keep the skin around the stoma wrinkle-free for a good seal.  Gently remove the used pouch (1-piece system) or the pouch and old wafer (2-piece system). Empty the pouch into the toilet. Save the closure clip to use again.  Wash the stoma itself and the skin around the stoma. Your stoma may bleed a little  when being washed. This is normal. Rinse and pat dry. You may use a wash cloth or soft paper towels (like Bounty), mild soap (like Dial, Safeguard, or [place]), and water. Avoid soaps that contain perfumes or lotions.  For a new pouch (1-piece system) or a new wafer (2-piece system), measure your stoma using the stoma guide in each box of supplies.  Trace the shape of your stoma onto the back of the new pouch or the back of the new wafer. Cut out the opening. Remove the paper backing and set it aside.  Optional: Apply a skin barrier powder to surrounding skin if it is irritated (bare or weeping), and dust off the excess. Optional: Apply a skin-prep wipe (such as Skin Prep or All-Kare) to the skin around the stoma, and let it dry. Do not apply this solution if the skin is irritated (red, tender, or broken) or if you have shaved around the stoma. Optional: Apply a skin barrier paste (such as Stomahesive, Coloplast, or Premium) around the opening cut in the back of the pouch or wafer. Allow it to dry for 30 to 60 seconds.  Hold the pouch (1-piece system) or wafer (2-piece system) with the sticky side toward your body. Make sure the skin around the stoma is wrinkle-free. Center the opening on the stoma, then press firmly to your abdomen (Fig. 4). Look in the mirror to check if you are placing the pouch, or wafer, in the right position. For a 2-piece system, snap the pouch onto the wafer. Make sure it snaps into place securely.  Place your hand over the stoma and the pouch or wafer for about 30 seconds. The heat from your hand can help the pouch or wafer stick to your skin.  Add deodorant (such as Super Banish or Nullo) to your pouch. Other options include food extracts such as vanilla oil and peppermint extract. Add about 10 drops of the deodorant to the pouch. Then apply the closure clamp. Note: Do not use toxic  chemicals or commercial cleaning agents in your pouch. These substances may harm the  stoma.  Optional: For extra seal, apply tape to all 4 sides around the pouch or wafer, as if you were framing a picture. You may use any brand of medical adhesive tape. Change your pouch every 5 to 7 days. Change it immediately if a leak occurs.  Wash your hands afterwards.  If you are wearing a 2-piece system, you may use 2 new pouches per week and alternate them. Rinse the pouch with mild soap and warm water and hang it to dry for the next day. Apply the fresh pouch. Alternate the 2 pouches like this for a week. After a week, change the wafer and begin with 2 new pouches. Place the old pouches in a plastic bag, and put them in the trash.   LIVING WITH AN OSTOMY  Emptying Your Pouch Empty your pouch when it is one-third full (of urine, stool, and/or gas). If you  wait until your pouch is fuller than this, it will be more difficult to empty and more noticeable. When you empty your pouch, either put toilet paper in the toilet bowl first, or flush the toilet while you empty the pouch. This will reduce splashing. You can empty the pouch between your legs or to one side while sitting, or while standing or stooping. If you have a 2-piece system, you can snap off the pouch to empty it. Remember that your stoma may function during this time. If you wish to rinse your pouch after you empty it, a [place] baster can be helpful. When using a baster, squirt water up into the pouch through the opening at the bottom. With a 2-piece system, you can snap off the pouch to rinse it. After rinsing  your pouch, empty it into the toilet. When rinsing your pouch at home, put a few granules of Dreft soap in the rinse water. This helps lubricate and freshen your pouch. The inside of your pouch can be sprayed with non-stick cooking oil (Pam spray). This may help reduce stool sticking to the inside of the pouch.  Bathing You may shower or bathe with your pouch on or off. Remember that your stoma may function during this  time.  The materials you use to wash your stoma and the skin around it should be clean, but they do not need to be sterile.  Wearing Your Pouch During hot weather, or if you perspire a lot in general, wear a cover over your pouch. This may prevent a rash on your skin under the pouch. Pouch covers are sold at ostomy supply stores. Wear the pouch inside your underwear for better support. Watch your weight. Any gain or loss of 10 to 15 pounds or more can change the way your pouch fits.  Going Away From Home A collapsible cup (like those that come in travel kits) or a soft plastic squirt bottle with a pull-up top (like a travel bottle for shampoo) can be used for rinsing your pouch when you are away from home. Tilt the opening of the pouch at an upward angle when using a cup to rinse.  Carry wet wipes or extra tissues to use in public bathrooms.  Carry an extra pouching system with you at all times.  Never keep ostomy supplies in the glove compartment of your car. Extreme heat or cold can damage the skin barriers and adhesive wafers on the pouch.  When you travel, carry your ostomy supplies with you at all times. Keep them within easy reach. Do not pack ostomy supplies in baggage that will be checked or otherwise separated from you, because your baggage might be lost. If you're traveling out of the country, it is helpful to have a letter stating that you are carrying ostomy supplies as a medical necessity.  If you need ostomy supplies while traveling, look in the yellow pages of the telephone book under "Surgical Supplies." Or call the local ostomy organization to find out where supplies are available.  Do not let your ostomy supplies get low. Always order new pouches before you use the last one.  Reducing Odor Limit foods such as broccoli, cabbage, onions, fish, and garlic in your diet to help reduce odor. Each time you empty your pouch, carefully clean the opening of the pouch, both inside  and outside, with toilet paper. Rinse your pouch 1 or 2 times daily after you empty it (see directions for emptying your pouch and going  away from home). Add deodorant (such as Super Banish or Nullo) to your pouch. Use air deodorizers in your bathroom. Do not add aspirin to your pouch. Even though aspirin can help prevent odor, it could cause ulcers on your stoma.  When to call the doctor Call the doctor if you have any of the following symptoms: Purple, black, or white stoma Severe cramps lasting more than 6 hours Severe watery discharge from the stoma lasting more than 6 hours No output from the colostomy for 3 days Excessive bleeding from your stoma Swelling of your stoma to more than 1/2-inch larger than usual Pulling inward of your stoma below skin level Severe skin irritation or deep ulcers Bulging or other changes in your abdomen  When to call your ostomy nurse Call your ostomy/enterostomal therapy (WOCN) nurse if any of the following occurs: Frequent leaking of your pouching system Change in size or appearance of your stoma, causing discomfort or problems with your pouch Skin rash or rawness Weight gain or loss that causes problems with your pouch     FREQUENTLY ASKED QUESTIONS   Why haven't you met any of these folks who have an ostomy?  Well, maybe you have! You just did not recognize them because an ostomy doesn't show. It can be kept secret if you wish. Why, maybe some of your best friends, office associates or neighbors have an ostomy ... you never can tell. People facing ostomy surgery have many quality-of-life questions like: Will you bulge? Smell? Make noises? Will you feel waste leaving your body? Will you be a captive of the toilet? Will you starve? Be a social outcast? Get/stay married? Have babies? Easily bathe, go swimming, bend over?  OK, let's look at what you can expect:   Will you bulge?  Remember, without part of the intestine or bladder, and its  contents, you should have a flatter tummy than before. You can expect to wear, with little exception, what you wore before surgery ... and this in-cludes tight clothing and bathing suits.   Will you smell?  Today, thanks to modern odor proof pouching systems, you can walk into an ostomy support group meeting and not smell anything that is foul or offensive. And, for those with an ileostomy or colostomy who are concerned about odor when emptying their pouch, there are in-pouch deodorants that can be used to eliminate any waste odors that may exist.   Will you make noises?  Everyone produces gas, especially if they are an air-swallower. But intestinal sounds that occur from time to time are no differ-ent than a gurgling tummy, and quite often your clothing will muffle any sounds.   Will you feel the waste discharges?  For those with a colostomy or ileostomy there might be a slight pressure when waste leaves your body, but understand that the intestines have no nerve endings, so there will be no unpleasant sensations. Those with a urostomy will probably be unaware of any kidney drainage.   Will you be a captive of the toilet?  Immediately post-op you will spend more time in the bathroom than you will after your body recovers from surgery. Every person is different, but on average those with an ileostomy or urostomy may empty their pouches 4 to 6 times a day; a little  less if you have a colostomy. The average wear time between pouch system changes is 3 to 5 days and the changing process should take less than 30 minutes.   Will I need to  be on a special diet? Most people return to their normal diet when they have recovered from surgery. Be sure to chew your food well, eat a well-balanced diet and drink plenty of fluids. If you experience problems with a certain food, wait a couple of weeks and try it again.  Will there be odor and noises? Pouching systems are designed to be odor-proof or  odor-resistant. There are deodorants that can be used in the pouch. Medications are also available to help reduce odor. Limit gas-producing foods and carbonated beverages. You will experience less gas and fewer noises as you heal from surgery.  How much time will it take to care for my ostomy? At first, you may spend a lot of time learning about your ostomy and how to take care of it. As you become more comfortable and skilled at changing the pouching system, it will take very little time to care for it.   Will I be able to return to work? People with ostomies can perform most jobs. As soon as you have healed from surgery, you should be able to return to work. Heavy lifting (more than 10 pounds) may be discouraged.   What about intimacy? Sexual relationships and intimacy are important and fulfilling aspects of your life. They should continue after ostomy surgery. Intimacy-related concerns should be discussed openly between you and your partner.   Can I wear regular clothing? You do not need to wear special clothing. Ostomy pouches are fairly flat and barely noticeable. Elastic undergarments will not hurt the stoma or prevent the ostomy from functioning.   Can I participate in sports? An ostomy should not limit your involvement in sports. Many people with ostomies are runners, skiers, swimmers or participate in other active lifestyles. Talk with your caregiver first before doing heavy physical activity.  Will you starve?  Not if you follow doctor's orders at each stage of your post-op adjustment. There is no such thing as an "ostomy diet". Some people with an ostomy will be able to eat and tolerate anything; others may find diffi-culty with some foods. Each person is an individual and must determine, by trial, what is best for them. A good practice for all is to drink plenty of water.   Will you be a social outcast?  Have you met anyone who has an ostomy and is a social outcast? Why should you be  the first? Only your attitude and self image will affect how you are treated. No confident person is an Grand are available if you need help or have questions about your ostomy.    Specially trained nurses called Wound, Ostomy Continence Nurses (WOCN) are available for consultation in most major medical centers.   Consider getting an ostomy consult at one of the outpatient ostomy clinics.     The [employer] (UOA) is a group made up of many local chapters throughout the [place]. These local groups hold meetings and provide support to prospective and existing ostomates. They sponsor educational events and have qualified visitors to make personal or telephone visits. Contact the UOA for the chapter nearest you and for other educational publications.   More detailed information can be found in Colostomy Guide, a publication of the [employer] (UOA). Contact UOA at 1-(212) [phone] or visit their web site at [email or web address] The website contains links to other sites, suppliers and resources.   [facility] Start Services:  Start at the website to  enlist for support.  Your Wound Ostomy (WOCN) nurse may have started this process. https://www.hollister.com/en/securestart  Secure Start services are designed to support people as they live their lives with an ostomy or neurogenic bladder. Enrolling is easy and at no cost to the patient. We realize that each person's needs and life journey are different. Through Secure Start services, we want to help people live their life, their way.  [facility], [place] [phone]  OPEN ABDOMINAL SURGERY: POST OP INSTRUCTIONS  Always review your discharge instruction sheet given to you by the facility where your surgery was performed.  IF YOU HAVE DISABILITY OR FAMILY LEAVE FORMS, YOU MUST BRING THEM TO THE OFFICE FOR PROCESSING.  PLEASE DO NOT GIVE THEM TO YOUR  DOCTOR.  1. A prescription for pain medication may be given to you upon discharge.  Take your pain medication as prescribed, if needed.  If narcotic pain medicine is not needed, then you may take acetaminophen (Tylenol) or ibuprofen (Advil) as needed. 2. Take your usually prescribed medications unless otherwise directed. 3. If you need a refill on your pain medication, please contact your pharmacy. They will contact our office to request authorization.  Prescriptions will not be filled after 5pm or on week-ends. 4. You should follow a light diet the first few days after arrival home, such as soup and crackers, pudding, etc.unless your doctor has advised otherwise. A high-fiber, low fat diet can be resumed as tolerated.   Be sure to include lots of fluids daily. Most patients will experience some swelling and bruising on the chest and neck area.  Ice packs will help.  Swelling and bruising can take several days to resolve 5. Most patients will experience some swelling and bruising in the area of the incision. Ice pack will help. Swelling and bruising can take several days to resolve..  6. It is common to experience some constipation if taking pain medication after surgery.  Increasing fluid intake and taking a stool softener will usually help or prevent this problem from occurring.  A mild laxative (Milk of Magnesia or Miralax) should be taken according to package directions if there are no bowel movements after 48 hours. 7.  You may have steri-strips (small skin tapes) in place directly over the incision.  These strips should be left on the skin for 7-10 days.  If your surgeon used skin glue on the incision, you may shower in 24 hours.  The glue will flake off over the next 2-3 weeks.  Any sutures or staples will be removed at the office during your follow-up visit. You may find that a light gauze bandage over your incision may keep your staples from being rubbed or pulled. You may shower and replace the  bandage daily. 8. ACTIVITIES:  You may resume regular (light) daily activities beginning the next day--such as daily self-care, walking, climbing stairs--gradually increasing activities as tolerated.  You may have sexual intercourse when it is comfortable.  Refrain from any heavy lifting or straining until approved by your doctor. a. You may drive when you no longer are taking prescription pain medication, you can comfortably wear a seatbelt, and you can safely maneuver your car and apply brakes  9. You should see your doctor in the office for a follow-up appointment approximately two weeks after your surgery.  Make sure that you call for this appointment within a day or two after you arrive home to insure a convenient appointment time.  WHEN TO CALL YOUR DOCTOR: 1. Fever over 101.0 2. Inability to urinate 3. Nausea and/or vomiting 4. Extreme swelling or bruising 5. Continued bleeding from incision. 6. Increased pain, redness, or drainage from the incision. 7. Difficulty swallowing or breathing 8. Muscle cramping or spasms. 9. Numbness or tingling in hands or feet or around lips.  The clinic staff is available to answer your questions during regular business hours.  Please don't hesitate to call and ask to speak to one of the nurses if you have concerns.  For further questions, please visit [email or web address]

## 2020-11-03 NOTE — Progress Notes (Signed)
PT Cancellation Note  Patient Details Name: Jonathan [name] MRN: [id number] DOB: 01/30/2001   Cancelled Treatment:    Reason Eval/Treat Not Completed: Pain limiting ability to participate today despite attempts to coordinate with delivery of pain medicine. Pt requesting PT return later when available for next dose of pain medicine later this afternoon.   Deland Pretty, DPT   Acute Rehabilitation Department Pager #: [phone]   Gaetana Michaelis 11/03/2020, 10:20 AM
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
## 2020-11-03 NOTE — Plan of Care (Signed)
  Problem: Education: Goal: Knowledge of General Education information will improve Description: Including pain rating scale, medication(s)/side effects and non-pharmacologic comfort measures Outcome: Progressing   Problem: Health Behavior/Discharge Planning: Goal: Ability to manage health-related needs will improve Outcome: Progressing   Problem: Clinical Measurements: Goal: Ability to maintain clinical measurements within normal limits will improve Outcome: Progressing Goal: Diagnostic test results will improve Outcome: Progressing Goal: Respiratory complications will improve Outcome: Progressing Goal: Cardiovascular complication will be avoided Outcome: Progressing   Problem: Activity: Goal: Risk for activity intolerance will decrease Outcome: Progressing   Problem: Nutrition: Goal: Adequate nutrition will be maintained Outcome: Progressing   Problem: Coping: Goal: Level of anxiety will decrease Outcome: Progressing   Problem: Elimination: Goal: Will not experience complications related to bowel motility Outcome: Progressing   Problem: Pain Managment: Goal: General experience of comfort will improve Outcome: Progressing   Problem: Safety: Goal: Ability to remain free from injury will improve Outcome: Progressing   Problem: Skin Integrity: Goal: Risk for impaired skin integrity will decrease Outcome: Progressing   
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
## 2020-11-04 ENCOUNTER — Other Ambulatory Visit (HOSPITAL_COMMUNITY): Payer: Self-pay

## 2020-11-04 MED ORDER — IBUPROFEN 600 MG PO TABS
600.0000 mg | ORAL_TABLET | Freq: Four times a day (QID) | ORAL | 0 refills | Status: DC | PRN
  Filled 2020-11-04: qty 30, 8d supply, fill #0

## 2020-11-04 MED ORDER — METHOCARBAMOL 500 MG PO TABS
1000.0000 mg | ORAL_TABLET | Freq: Four times a day (QID) | ORAL | 0 refills | Status: DC | PRN
  Filled 2020-11-04: qty 30, 4d supply, fill #0

## 2020-11-04 MED ORDER — ACETAMINOPHEN 500 MG PO TABS
1000.0000 mg | ORAL_TABLET | Freq: Four times a day (QID) | ORAL | 0 refills | Status: DC
  Filled 2020-11-04: qty 30, 4d supply, fill #0

## 2020-11-04 MED ORDER — OXYCODONE HCL 10 MG PO TABS
10.0000 mg | ORAL_TABLET | Freq: Four times a day (QID) | ORAL | 0 refills | Status: AC | PRN
  Filled 2020-11-04: qty 28, 7d supply, fill #0

## 2020-11-04 MED ORDER — LEVETIRACETAM 500 MG PO TABS: 500.0000 mg | ORAL_TABLET | Freq: Two times a day (BID) | ORAL | 0 refills | Status: DC

## 2020-11-04 MED ORDER — METOPROLOL TARTRATE 25 MG PO TABS
25.0000 mg | ORAL_TABLET | Freq: Two times a day (BID) | ORAL | 0 refills | Status: DC
  Filled 2020-11-04: qty 60, 30d supply, fill #0

## 2020-11-04 MED ORDER — GABAPENTIN 400 MG PO CAPS
400.0000 mg | ORAL_CAPSULE | Freq: Three times a day (TID) | ORAL | 0 refills | Status: DC
  Filled 2020-11-04: qty 90, 30d supply, fill #0
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
## 2020-11-04 NOTE — Progress Notes (Signed)
Patient ID: Jonathan [name], male   DOB: 08/23/2000, 20 y.o.   MRN: [id number]   Noted that patient will be discharged today.  Please discharge with his Foley catheter. I will arrange outpatient cystogram and follow up for voiding trial.

## 2020-11-05 LAB — CULTURE, BLOOD (ROUTINE X 2)
Culture: NO GROWTH
Culture: NO GROWTH
Special Requests: ADEQUATE
Special Requests: ADEQUATE

## 2020-11-07 ENCOUNTER — Other Ambulatory Visit (HOSPITAL_COMMUNITY): Payer: Self-pay | Admitting: Urology

## 2020-11-07 ENCOUNTER — Other Ambulatory Visit: Payer: Self-pay | Admitting: Urology

## 2020-11-07 DIAGNOSIS — S3729XA Other injury of bladder, initial encounter: Secondary | ICD-10-CM

## 2020-11-10 ENCOUNTER — Encounter (HOSPITAL_COMMUNITY): Payer: Self-pay | Admitting: Nurse Practitioner

## 2020-11-21 ENCOUNTER — Ambulatory Visit (HOSPITAL_COMMUNITY)
Admission: RE | Admit: 2020-11-21 | Discharge: 2020-11-21 | Disposition: A | Discharge status: Home / Self Care | Payer: Medicaid Other | Source: Ambulatory Visit | Attending: Urology | Admitting: Urology

## 2020-11-21 ENCOUNTER — Other Ambulatory Visit: Payer: Self-pay

## 2020-11-21 DIAGNOSIS — S3663XA Laceration of rectum, initial encounter: Secondary | ICD-10-CM | POA: Insufficient documentation

## 2020-11-21 DIAGNOSIS — W3400XA Accidental discharge from unspecified firearms or gun, initial encounter: Secondary | ICD-10-CM | POA: Insufficient documentation

## 2020-11-21 DIAGNOSIS — S3723XA Laceration of bladder, initial encounter: Secondary | ICD-10-CM | POA: Insufficient documentation

## 2020-11-21 DIAGNOSIS — S31619A Laceration without foreign body of abdominal wall, unspecified quadrant with penetration into peritoneal cavity, initial encounter: Secondary | ICD-10-CM | POA: Insufficient documentation

## 2020-11-21 DIAGNOSIS — Z933 Colostomy status: Secondary | ICD-10-CM | POA: Insufficient documentation

## 2020-11-21 DIAGNOSIS — S31020A Laceration with foreign body of lower back and pelvis without penetration into retroperitoneum, initial encounter: Secondary | ICD-10-CM | POA: Insufficient documentation

## 2020-11-21 DIAGNOSIS — S3729XA Other injury of bladder, initial encounter: Secondary | ICD-10-CM

## 2020-11-21 MED ORDER — IOTHALAMATE MEGLUMINE 17.2 % UR SOLN
250.0000 mL | Freq: Once | URETHRAL | Status: AC | PRN
  Administered 2020-11-21: 250 mL via INTRAVESICAL

## 2020-11-22 ENCOUNTER — Encounter (HOSPITAL_COMMUNITY): Payer: Self-pay

## 2020-11-22 ENCOUNTER — Emergency Department (HOSPITAL_COMMUNITY)
Admission: EM | Admit: 2020-11-22 | Discharge: 2020-11-22 | Disposition: A | Discharge status: Home / Self Care | Payer: Medicaid Other | Attending: Emergency Medicine | Admitting: Emergency Medicine

## 2020-11-22 DIAGNOSIS — M79662 Pain in left lower leg: Secondary | ICD-10-CM | POA: Insufficient documentation

## 2020-11-22 DIAGNOSIS — Z79899 Other long term (current) drug therapy: Secondary | ICD-10-CM | POA: Insufficient documentation

## 2020-11-22 DIAGNOSIS — F17290 Nicotine dependence, other tobacco product, uncomplicated: Secondary | ICD-10-CM | POA: Insufficient documentation

## 2020-11-22 DIAGNOSIS — R569 Unspecified convulsions: Secondary | ICD-10-CM | POA: Insufficient documentation

## 2020-11-22 HISTORY — DX: Unspecified firearm discharge, undetermined intent, initial encounter: Y24.9XXA

## 2020-11-22 HISTORY — DX: Accidental discharge from unspecified firearms or gun, initial encounter: W34.00XA

## 2020-11-22 LAB — CBC WITH DIFFERENTIAL/PLATELET
Abs Immature Granulocytes: 0.04 10*3/uL (ref 0.00–0.07)
Basophils Absolute: 0 10*3/uL (ref 0.0–0.1)
Basophils Relative: 0 %
Eosinophils Absolute: 0.1 10*3/uL (ref 0.0–0.5)
Eosinophils Relative: 1 %
HCT: 33.1 % — ABNORMAL LOW (ref 39.0–52.0)
Hemoglobin: 10.2 g/dL — ABNORMAL LOW (ref 13.0–17.0)
Immature Granulocytes: 0 %
Lymphocytes Relative: 9 %
Lymphs Abs: 0.9 10*3/uL (ref 0.7–4.0)
MCH: 28.3 pg (ref 26.0–34.0)
MCHC: 30.8 g/dL (ref 30.0–36.0)
MCV: 91.9 fL (ref 80.0–100.0)
Monocytes Absolute: 0.9 10*3/uL (ref 0.1–1.0)
Monocytes Relative: 9 %
Neutro Abs: 7.6 10*3/uL (ref 1.7–7.7)
Neutrophils Relative %: 81 %
Platelets: 395 10*3/uL (ref 150–400)
RBC: 3.6 MIL/uL — ABNORMAL LOW (ref 4.22–5.81)
RDW: 14.7 % (ref 11.5–15.5)
WBC: 9.5 10*3/uL (ref 4.0–10.5)
nRBC: 0 % (ref 0.0–0.2)

## 2020-11-22 LAB — COMPREHENSIVE METABOLIC PANEL
ALT: 11 U/L (ref 0–44)
AST: 13 U/L — ABNORMAL LOW (ref 15–41)
Albumin: 2.6 g/dL — ABNORMAL LOW (ref 3.5–5.0)
Alkaline Phosphatase: 71 U/L (ref 38–126)
Anion gap: 6 (ref 5–15)
BUN: 9 mg/dL (ref 6–20)
CO2: 28 mmol/L (ref 22–32)
Calcium: 8.6 mg/dL — ABNORMAL LOW (ref 8.9–10.3)
Chloride: 104 mmol/L (ref 98–111)
Creatinine, Ser: 0.69 mg/dL (ref 0.61–1.24)
GFR, Estimated: >60 mL/min (ref >60)
Glucose, Bld: 110 mg/dL — ABNORMAL HIGH (ref 70–99)
Potassium: 3.2 mmol/L — ABNORMAL LOW (ref 3.5–5.1)
Sodium: 138 mmol/L (ref 135–145)
Total Bilirubin: 0.4 mg/dL (ref 0.3–1.2)
Total Protein: 5.7 g/dL — ABNORMAL LOW (ref 6.5–8.1)

## 2020-11-22 MED ORDER — METOPROLOL TARTRATE 25 MG PO TABS
25.0000 mg | ORAL_TABLET | Freq: Two times a day (BID) | ORAL | Status: DC
  Administered 2020-11-22: 25 mg via ORAL
  Filled 2020-11-22: qty 1

## 2020-11-22 MED ORDER — METOPROLOL TARTRATE 5 MG/5ML IV SOLN
2.5000 mg | Freq: Once | INTRAVENOUS | Status: AC
  Administered 2020-11-22: 2.5 mg via INTRAVENOUS
  Filled 2020-11-22: qty 5

## 2020-11-22 MED ORDER — ACETAMINOPHEN 500 MG PO TABS
1000.0000 mg | ORAL_TABLET | Freq: Four times a day (QID) | ORAL | Status: DC
  Administered 2020-11-22 (×2): 1000 mg via ORAL
  Filled 2020-11-22 (×2): qty 2

## 2020-11-22 MED ORDER — GABAPENTIN 300 MG PO CAPS
400.0000 mg | ORAL_CAPSULE | Freq: Three times a day (TID) | ORAL | Status: DC
  Administered 2020-11-22: 400 mg via ORAL
  Filled 2020-11-22: qty 1

## 2020-11-22 MED ORDER — OXYCODONE HCL 5 MG PO TABS
5.0000 mg | ORAL_TABLET | Freq: Once | ORAL | Status: AC
  Administered 2020-11-22: 5 mg via ORAL
  Filled 2020-11-22: qty 1

## 2020-11-22 MED ORDER — LEVETIRACETAM 500 MG PO TABS
500.0000 mg | ORAL_TABLET | Freq: Two times a day (BID) | ORAL | Status: DC
  Administered 2020-11-22: 500 mg via ORAL
  Filled 2020-11-22: qty 1

## 2020-11-22 MED ORDER — GABAPENTIN 400 MG PO CAPS: 400.0000 mg | ORAL_CAPSULE | Freq: Three times a day (TID) | ORAL | 0 refills | Status: DC

## 2020-11-22 MED ORDER — LEVETIRACETAM IN NACL 1000 MG/100ML IV SOLN
1000.0000 mg | Freq: Once | INTRAVENOUS | Status: AC
  Administered 2020-11-22: 1000 mg via INTRAVENOUS
  Filled 2020-11-22: qty 100

## 2020-11-22 MED ORDER — METOPROLOL TARTRATE 25 MG PO TABS
25.0000 mg | ORAL_TABLET | Freq: Two times a day (BID) | ORAL | 0 refills | Status: DC
Start: 2020-11-22 — End: 2021-02-14

## 2020-11-22 MED ORDER — SODIUM CHLORIDE 0.9 % IV BOLUS
1000.0000 mL | Freq: Once | INTRAVENOUS | Status: AC
  Administered 2020-11-22: 1000 mL via INTRAVENOUS

## 2020-11-22 MED ORDER — METHOCARBAMOL 500 MG PO TABS: 1000.0000 mg | ORAL_TABLET | Freq: Four times a day (QID) | ORAL | 0 refills | Status: DC | PRN

## 2020-11-22 MED ORDER — METHOCARBAMOL 500 MG PO TABS
1000.0000 mg | ORAL_TABLET | Freq: Four times a day (QID) | ORAL | Status: DC | PRN
  Administered 2020-11-22: 1000 mg via ORAL
  Filled 2020-11-22: qty 2

## 2020-11-22 MED ORDER — LEVETIRACETAM 500 MG PO TABS: 500.0000 mg | ORAL_TABLET | Freq: Two times a day (BID) | ORAL | 0 refills | Status: DC

## 2020-11-22 MED ORDER — KETOROLAC TROMETHAMINE 30 MG/ML IJ SOLN
30.0000 mg | Freq: Once | INTRAMUSCULAR | Status: AC
  Administered 2020-11-22: 30 mg via INTRAVENOUS
  Filled 2020-11-22: qty 1

## 2020-11-22 MED ORDER — IBUPROFEN 400 MG PO TABS: 600.0000 mg | ORAL_TABLET | Freq: Four times a day (QID) | ORAL | Status: DC | PRN
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
## 2020-11-22 NOTE — Progress Notes (Signed)
CSW spoke with patient and he stated he would like to go to rehab. CSW stated due to him not having insurance that private pay ranges from 5,000-10,000 a month. CSW encouraged patient to apply for insurance.

## 2020-11-22 NOTE — ED Notes (Signed)
Pt is asking for pain meds at this time. MD sent a secure chat at this time. Waiting for new orders.

## 2020-11-22 NOTE — Discharge Instructions (Addendum)
If you have an ostomy and are having trouble obtaining supplies or your insurance does not cover your supplies, the following resources may be helpful.  [employer] [phone]  [employer] Ost[employer] Access program: 463-471-8111  [employer] [phone]  [facility] Assistance Program: [phone] opt. 2  [facility]: [phone] (at cost, if no insurance)  [facility]: [phone] (at cost, if no insurance)  [place] only: [employer]: 901-627-3573 (small fee)  [employer]: 530-351-5581 www.https://richard.com/ (will send supplies for the shipping cost only)  [employer] ([employer] [place]): 8140045651 or www.uoaa.org  www.amazon.com search: ostomy supplies  www.fowusa.org [employer]  www.ostomyangels.com  www.stomabags.com or 863-528-6033 supplies for non or uninsured

## 2020-11-22 NOTE — Discharge Planning (Signed)
RNCM consulted regarding ostomy supplies for pt.  RNCM included ostomy supply options on pt After Visit Summary (AVS) so pt can call/visit website to request supplies and or enroll in savings programs offered.

## 2020-11-22 NOTE — ED Notes (Signed)
MD sent a secured chat of pt HR ranging from 113-119BPM. Waiting for orders. Will continue to monitor.

## 2020-11-22 NOTE — ED Notes (Signed)
MD notified of pt arrival and pain. Pt is requesting pain medicine.
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
## 2020-11-22 NOTE — ED Provider Notes (Addendum)
I was the attending physician present during this patient's disposition.  Patient was originally seen by a different physician.  Please refer to that physician's note and social work for full details.  At this time patient is going to be discharged, all of his medications have been refilled and resources have been provided by social work.  Patient is sitting up, conversational, baseline neurologic status, eating and drinking.  Appears stable and well for discharge.   Rozelle Logan, DO 11/22/20 2028    Rozelle Logan, DO 11/22/20 2028

## 2020-11-22 NOTE — Discharge Planning (Signed)
RNCM consulted regarding safe discharge planning (Home with Home Health vs Skilled Nursing Facility Placement).  Physical Therapy evaluation placed; will follow up after recommendations from PT.     
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
## 2020-11-22 NOTE — ED Notes (Signed)
Patient and mother given discharge paperwork and instructions. Verbalized undersensing of teaching. IV d/c with cath tip intact. Wheeled to exit in NAD.

## 2020-11-22 NOTE — ED Notes (Signed)
Pt is requesting Dilaudid IVP for pain. MD sent a secure chat. PO pain meds given with pt stating, "It helped mildly."

## 2020-11-22 NOTE — ED Notes (Signed)
Colostomy supplies ordered. Colostomy bag is currently leaking. Waiting for materials.
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
## 2020-11-30 ENCOUNTER — Encounter: Payer: Self-pay | Admitting: *Deleted

## 2020-12-07 ENCOUNTER — Encounter: Payer: Self-pay | Admitting: Internal Medicine

## 2020-12-07 ENCOUNTER — Ambulatory Visit (INDEPENDENT_AMBULATORY_CARE_PROVIDER_SITE_OTHER): Payer: Self-pay | Admitting: Internal Medicine

## 2020-12-07 ENCOUNTER — Other Ambulatory Visit: Payer: Self-pay

## 2020-12-07 ENCOUNTER — Telehealth: Payer: Self-pay

## 2020-12-07 ENCOUNTER — Other Ambulatory Visit: Payer: Self-pay | Admitting: Student

## 2020-12-07 VITALS — BP 112/73 | HR 81 | Temp 98.1°F | Ht 71.0 in | Wt 160.0 lb

## 2020-12-07 DIAGNOSIS — I10 Essential (primary) hypertension: Secondary | ICD-10-CM

## 2020-12-07 DIAGNOSIS — E876 Hypokalemia: Secondary | ICD-10-CM | POA: Insufficient documentation

## 2020-12-07 DIAGNOSIS — I471 Supraventricular tachycardia: Secondary | ICD-10-CM

## 2020-12-07 DIAGNOSIS — S32009S Unspecified fracture of unspecified lumbar vertebra, sequela: Secondary | ICD-10-CM

## 2020-12-07 DIAGNOSIS — G40909 Epilepsy, unspecified, not intractable, without status epilepticus: Secondary | ICD-10-CM

## 2020-12-07 MED ORDER — OXYCODONE HCL 5 MG PO CAPS: 10.0000 mg | ORAL_CAPSULE | Freq: Three times a day (TID) | ORAL | 0 refills | Status: DC | PRN

## 2020-12-07 NOTE — Addendum Note (Signed)
Addended byEvlyn Kanner on: 12/07/2020 01:20 PM   Modules accepted: Orders
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
## 2020-12-07 NOTE — Assessment & Plan Note (Signed)
#  Paroxysmal SVT: He is status post ablation of accessory pathway.  Heart rate unremarkable

## 2020-12-07 NOTE — Assessment & Plan Note (Signed)
#  Hypokalemia: Follow BMP

## 2020-12-07 NOTE — Telephone Encounter (Signed)
Hi Dr. Dortha Schwalbe,  Patient's mother called and states his meds are not at the pharmacy from today's visit. She said you were going to send in the pain medicine and something else (she can't remember what it was).  Please send to [facility] [place] [address] Thank you, Murphy

## 2020-12-07 NOTE — Assessment & Plan Note (Signed)
#  Seizure disorder: Well-controlled on Keppra

## 2020-12-07 NOTE — Patient Instructions (Signed)
Hi Ehtan,  Thanks for seeing me today.  I am sorry about the pain you are going through.  As we discussed, I will send a pain medicine oxycodone 10 mg 3 times a day as needed.  We will also sign a pain contract.  Please let us know if you need anything else.  Once you get insurance, we will refer you to the pain clinic  Please help patient obtain financial assistance as he would need a pain medicine specialist.  Take care! Dr. Dortha Schwalbe  Please call the internal medicine center clinic if you have any questions or concerns, we may be able to help and keep you from a long and expensive emergency room wait. Our clinic and after hours phone number is [phone], the best time to call is Monday through Friday 9 am to 4 pm but there is always someone available 24/7 if you have an emergency. If you need medication refills please notify your pharmacy one week in advance and they will send [place] a request.   If you have not gotten the COVID vaccine, I recommend doing so:  You may get it at your local [facility] or [facility] OR To schedule an appointment for a COVID vaccine or be added to the vaccine wait list: Go to [email or web address]   OR Go to [email or web address]                  OR Call [phone]                                     OR Call [phone] and select Option 2

## 2020-12-08 ENCOUNTER — Ambulatory Visit (INDEPENDENT_AMBULATORY_CARE_PROVIDER_SITE_OTHER): Payer: Self-pay | Admitting: Orthopedic Surgery

## 2020-12-08 ENCOUNTER — Ambulatory Visit: Payer: Self-pay

## 2020-12-08 ENCOUNTER — Encounter: Payer: Self-pay | Admitting: Orthopedic Surgery

## 2020-12-08 DIAGNOSIS — S21232A Puncture wound without foreign body of left back wall of thorax without penetration into thoracic cavity, initial encounter: Secondary | ICD-10-CM

## 2020-12-08 LAB — BMP8+ANION GAP
Anion Gap: 21 mmol/L — ABNORMAL HIGH (ref 10.0–18.0)
BUN/Creatinine Ratio: 13 (ref 9–20)
BUN: 9 mg/dL (ref 6–20)
CO2: 21 mmol/L (ref 20–29)
Calcium: 10.3 mg/dL — ABNORMAL HIGH (ref 8.7–10.2)
Chloride: 98 mmol/L (ref 96–106)
Creatinine, Ser: 0.67 mg/dL — ABNORMAL LOW (ref 0.76–1.27)
Glucose: 98 mg/dL (ref 65–99)
Potassium: 4.4 mmol/L (ref 3.5–5.2)
Sodium: 140 mmol/L (ref 134–144)
eGFR: 136 mL/min/{1.73_m2} (ref >59)

## 2020-12-08 NOTE — Telephone Encounter (Signed)
Hi Stacee,   Thanks for letting me know. Will send it in by the end of today. Thanks.
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
## 2020-12-09 ENCOUNTER — Ambulatory Visit (HOSPITAL_COMMUNITY)
Admission: RE | Admit: 2020-12-09 | Discharge: 2020-12-09 | Disposition: A | Discharge status: Home / Self Care | Payer: Self-pay | Source: Ambulatory Visit | Attending: Surgery | Admitting: Surgery

## 2020-12-09 DIAGNOSIS — Z433 Encounter for attention to colostomy: Secondary | ICD-10-CM | POA: Insufficient documentation

## 2020-12-09 NOTE — Discharge Instructions (Signed)
[facility] to set up with [facility] [employer] given 2 pouches and barrier rings sent home.  Empty when 1/3 full

## 2020-12-09 NOTE — Addendum Note (Signed)
Addended byPrescott Parma on: 12/09/2020 09:20 AM   Modules accepted: Orders
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
## 2020-12-12 NOTE — Progress Notes (Signed)
Internal Medicine Clinic Attending  Case discussed with Dr. Dortha Schwalbe  At the time of the visit.  We reviewed the resident's history and exam and pertinent patient test results.  I agree with the assessment, diagnosis, and plan of care documented in the resident's note. Pain management and counseling will be integral to ongoing care.  This is a very unfortunate case; we will do our best to support him and his family.

## 2020-12-13 ENCOUNTER — Other Ambulatory Visit: Payer: Self-pay | Admitting: Student

## 2020-12-13 ENCOUNTER — Telehealth: Payer: Self-pay

## 2020-12-13 DIAGNOSIS — S32009S Unspecified fracture of unspecified lumbar vertebra, sequela: Secondary | ICD-10-CM

## 2020-12-13 MED ORDER — OXYCODONE HCL 5 MG PO CAPS: 10.0000 mg | ORAL_CAPSULE | Freq: Three times a day (TID) | ORAL | 0 refills | Status: DC | PRN

## 2020-12-13 MED ORDER — OXYCODONE HCL 5 MG PO TABS: 10.0000 mg | ORAL_TABLET | Freq: Three times a day (TID) | ORAL | 0 refills | Status: DC | PRN
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
## 2020-12-13 NOTE — Progress Notes (Signed)
Prescription sent earlier today to [facility] for 150 capsules of oxycodone 5mg  TID. Unfortunately, the pharmacy did not have enough capsules. Patient's mother found a pharmacy with enough of the tablets to fill the prescription. I have sent 150 tablets of oxycodone 5mg  to [facility] on [address].

## 2020-12-13 NOTE — Telephone Encounter (Signed)
Returned call to pharmacist at [facility]. She was able to have Rx go through, however, they only have 84 tabs. Patient would lose the remaining tabs if she fills this. Spoke with Patient's mother who will call around to other pharmacies so Rx can be sent to one that has the full 150 tabs.

## 2020-12-13 NOTE — Progress Notes (Signed)
Original RX sent on 6/1 for oxycodone 10mg  TID for 30 pills instead of 30 days total. For 30 days worth, will require 180 capsules of oxycodone 5mg . Therefore, will send in prescription for 150 capsules for the remaining days.

## 2020-12-13 NOTE — Telephone Encounter (Addendum)
Mother called back. States she is at the [facility] on [address] and they have 150 tabs (not caps) of oxy 5 mg. Please resend to this pharmacy. Rx cancelled at [facility].

## 2020-12-13 NOTE — Telephone Encounter (Signed)
[facility] with [facility] requesting to speak with a nurse about doctor DEA number is error. Please call back.

## 2020-12-15 ENCOUNTER — Other Ambulatory Visit: Payer: Self-pay | Admitting: Student

## 2020-12-20 ENCOUNTER — Ambulatory Visit (INDEPENDENT_AMBULATORY_CARE_PROVIDER_SITE_OTHER): Payer: Self-pay | Admitting: Physical Therapy

## 2020-12-20 ENCOUNTER — Encounter: Payer: Self-pay | Admitting: Physical Therapy

## 2020-12-20 ENCOUNTER — Other Ambulatory Visit: Payer: Self-pay

## 2020-12-20 DIAGNOSIS — M25662 Stiffness of left knee, not elsewhere classified: Secondary | ICD-10-CM

## 2020-12-20 DIAGNOSIS — M6281 Muscle weakness (generalized): Secondary | ICD-10-CM

## 2020-12-20 DIAGNOSIS — R262 Difficulty in walking, not elsewhere classified: Secondary | ICD-10-CM

## 2020-12-20 DIAGNOSIS — M25652 Stiffness of left hip, not elsewhere classified: Secondary | ICD-10-CM

## 2020-12-20 NOTE — Patient Instructions (Signed)
Access Code: [email or web address] URL: [email or web address] Date: 12/20/2020 Prepared by: Zebedee Iba  Exercises Supine Isometric Hamstring Set - 3 x daily - 7 x weekly - 2 sets - 10 reps - 5 hold Hooklying Clamshell with Resistance - 3 x daily - 7 x weekly - 2 sets - 10 reps - 5 hold Supine Quad Set - 3 x daily - 7 x weekly - 2 sets - 10 reps - 5 hold Supine Bridge with Resistance Band - 2 x daily - 7 x weekly - 2 sets - 10 reps
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
## 2020-12-27 ENCOUNTER — Ambulatory Visit (INDEPENDENT_AMBULATORY_CARE_PROVIDER_SITE_OTHER): Payer: Medicaid Other | Admitting: Physical Therapy

## 2020-12-27 ENCOUNTER — Encounter: Payer: Self-pay | Admitting: Physical Therapy

## 2020-12-27 ENCOUNTER — Other Ambulatory Visit: Payer: Self-pay

## 2020-12-27 DIAGNOSIS — M25652 Stiffness of left hip, not elsewhere classified: Secondary | ICD-10-CM

## 2020-12-27 DIAGNOSIS — R262 Difficulty in walking, not elsewhere classified: Secondary | ICD-10-CM

## 2020-12-27 DIAGNOSIS — M25662 Stiffness of left knee, not elsewhere classified: Secondary | ICD-10-CM

## 2020-12-27 DIAGNOSIS — M6281 Muscle weakness (generalized): Secondary | ICD-10-CM
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
## 2020-12-27 NOTE — Patient Instructions (Signed)
Access Code: [email or web address] URL: [email or web address] Date: 12/27/2020 Prepared by: Zebedee Iba  Exercises Supine Bridge with Resistance Band - 2 x daily - 7 x weekly - 2 sets - 10 reps Supine Heel Slide - 2 x daily - 7 x weekly - 2 sets - 10 reps Bent Knee Fallouts - 2 x daily - 7 x weekly - 3 sets - 10 reps Supine Quad Set - 2 x daily - 7 x weekly - 2 sets - 10 reps - 5 hold

## 2020-12-28 ENCOUNTER — Telehealth: Payer: Self-pay | Admitting: Internal Medicine

## 2020-12-28 NOTE — Telephone Encounter (Signed)
The following is from patient's AVS from ED visit on 11/22/20:  If you have an ostomy and are having trouble obtaining supplies or your insurance does not cover your supplies, the following resources may be helpful. American [employer] [phone] [employer] Ost[facility] Access program: [phone] [employer] [phone] [facility] Assistance Program: [phone] opt. 2 [facility] [employer]: [phone] (at cost, if no insurance) [facility]: [phone] (at cost, if no insurance) [place] only: [employer]: [phone] (small fee) [employer]: [phone] www.https://richard.com/ (will send supplies for the shipping cost only) [employer] ([employer] [place]): (236)[phone] or www.uoaa.org www.amazon.com search: ostomy supplies www.fowusa.org [employer] www.ostomyangels.com www.stomabags.com or [phone] supplies for non or Uninsured  Spoke with mom and patient. They will go to MyChart to obtain this info. If they are not able to view it they will call back and a copy can be mailed to them.

## 2020-12-28 NOTE — Telephone Encounter (Signed)
Rec'd call from the patient's Mother Jonathan [name]  requesting help with getting the patient Jonathan [name].  The patient is in need of Syringes, Patches, etc.  This is an uninsured patient who rec'd [name] from [facility] upon his D/C.

## 2020-12-28 NOTE — Telephone Encounter (Signed)
Pt's mother requesting to speak with a nurse about Colostomy supplies. She would like to speak with a nurse.

## 2020-12-28 NOTE — Telephone Encounter (Signed)
Mom called back. Patient was able to view document with ostomy resources.

## 2020-12-29 ENCOUNTER — Ambulatory Visit (INDEPENDENT_AMBULATORY_CARE_PROVIDER_SITE_OTHER): Payer: Self-pay | Admitting: Physical Therapy

## 2020-12-29 ENCOUNTER — Other Ambulatory Visit: Payer: Self-pay

## 2020-12-29 DIAGNOSIS — M25652 Stiffness of left hip, not elsewhere classified: Secondary | ICD-10-CM

## 2020-12-29 DIAGNOSIS — R262 Difficulty in walking, not elsewhere classified: Secondary | ICD-10-CM

## 2020-12-29 DIAGNOSIS — M25662 Stiffness of left knee, not elsewhere classified: Secondary | ICD-10-CM

## 2020-12-29 DIAGNOSIS — M6281 Muscle weakness (generalized): Secondary | ICD-10-CM
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
## 2021-01-03 ENCOUNTER — Telehealth: Payer: Self-pay | Admitting: Physical Therapy

## 2021-01-03 ENCOUNTER — Encounter: Payer: Medicaid Other | Admitting: Physical Therapy

## 2021-01-03 NOTE — Telephone Encounter (Signed)
Spoke with pt about missed visit. Pt states unable to come in today. Confirmed next appt time.   Zebedee Iba PT, DPT 01/03/21 9:50 AM

## 2021-01-10 ENCOUNTER — Encounter: Payer: Self-pay | Admitting: *Deleted

## 2021-01-10 ENCOUNTER — Ambulatory Visit (INDEPENDENT_AMBULATORY_CARE_PROVIDER_SITE_OTHER): Payer: Self-pay | Admitting: Physical Therapy

## 2021-01-10 ENCOUNTER — Encounter: Payer: Self-pay | Admitting: Physical Therapy

## 2021-01-10 ENCOUNTER — Other Ambulatory Visit: Payer: Self-pay

## 2021-01-10 DIAGNOSIS — M25662 Stiffness of left knee, not elsewhere classified: Secondary | ICD-10-CM

## 2021-01-10 DIAGNOSIS — M6281 Muscle weakness (generalized): Secondary | ICD-10-CM

## 2021-01-10 DIAGNOSIS — R262 Difficulty in walking, not elsewhere classified: Secondary | ICD-10-CM

## 2021-01-10 DIAGNOSIS — M25652 Stiffness of left hip, not elsewhere classified: Secondary | ICD-10-CM
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
## 2021-01-12 ENCOUNTER — Other Ambulatory Visit: Payer: Self-pay

## 2021-01-12 ENCOUNTER — Emergency Department (HOSPITAL_COMMUNITY)
Admission: EM | Admit: 2021-01-12 | Discharge: 2021-01-13 | Disposition: A | Discharge status: Home / Self Care | Payer: Medicaid Other | Attending: Emergency Medicine | Admitting: Emergency Medicine

## 2021-01-12 ENCOUNTER — Encounter (HOSPITAL_COMMUNITY): Payer: Self-pay | Admitting: Emergency Medicine

## 2021-01-12 DIAGNOSIS — G40909 Epilepsy, unspecified, not intractable, without status epilepticus: Secondary | ICD-10-CM | POA: Insufficient documentation

## 2021-01-12 DIAGNOSIS — R569 Unspecified convulsions: Secondary | ICD-10-CM

## 2021-01-12 DIAGNOSIS — R000 Tachycardia, unspecified: Secondary | ICD-10-CM | POA: Insufficient documentation

## 2021-01-12 DIAGNOSIS — Z87891 Personal history of nicotine dependence: Secondary | ICD-10-CM | POA: Insufficient documentation

## 2021-01-12 LAB — RAPID URINE DRUG SCREEN, HOSP PERFORMED
Amphetamines: NOT DETECTED
Barbiturates: NOT DETECTED
Benzodiazepines: NOT DETECTED
Cocaine: NOT DETECTED
Opiates: NOT DETECTED
Tetrahydrocannabinol: POSITIVE — AB

## 2021-01-12 LAB — CBC WITH DIFFERENTIAL/PLATELET
Abs Immature Granulocytes: 0.05 10*3/uL (ref 0.00–0.07)
Basophils Absolute: 0 10*3/uL (ref 0.0–0.1)
Basophils Relative: 0 %
Eosinophils Absolute: 0 10*3/uL (ref 0.0–0.5)
Eosinophils Relative: 0 %
HCT: 34 % — ABNORMAL LOW (ref 39.0–52.0)
Hemoglobin: 10.8 g/dL — ABNORMAL LOW (ref 13.0–17.0)
Immature Granulocytes: 0 %
Lymphocytes Relative: 5 %
Lymphs Abs: 0.8 10*3/uL (ref 0.7–4.0)
MCH: 27.6 pg (ref 26.0–34.0)
MCHC: 31.8 g/dL (ref 30.0–36.0)
MCV: 86.7 fL (ref 80.0–100.0)
Monocytes Absolute: 0.8 10*3/uL (ref 0.1–1.0)
Monocytes Relative: 5 %
Neutro Abs: 13.7 10*3/uL — ABNORMAL HIGH (ref 1.7–7.7)
Neutrophils Relative %: 90 %
Platelets: 491 10*3/uL — ABNORMAL HIGH (ref 150–400)
RBC: 3.92 MIL/uL — ABNORMAL LOW (ref 4.22–5.81)
RDW: 13.3 % (ref 11.5–15.5)
WBC: 15.4 10*3/uL — ABNORMAL HIGH (ref 4.0–10.5)
nRBC: 0 % (ref 0.0–0.2)

## 2021-01-12 LAB — BASIC METABOLIC PANEL
Anion gap: 9 (ref 5–15)
BUN: 5 mg/dL — ABNORMAL LOW (ref 6–20)
CO2: 27 mmol/L (ref 22–32)
Calcium: 8.9 mg/dL (ref 8.9–10.3)
Chloride: 101 mmol/L (ref 98–111)
Creatinine, Ser: 0.66 mg/dL (ref 0.61–1.24)
GFR, Estimated: >60 mL/min (ref >60)
Glucose, Bld: 123 mg/dL — ABNORMAL HIGH (ref 70–99)
Potassium: 3.9 mmol/L (ref 3.5–5.1)
Sodium: 137 mmol/L (ref 135–145)

## 2021-01-12 LAB — CBG MONITORING, ED: Glucose-Capillary: 125 mg/dL — ABNORMAL HIGH (ref 70–99)

## 2021-01-12 LAB — ETHANOL: Alcohol, Ethyl (B): <10 mg/dL (ref <10)

## 2021-01-12 MED ORDER — LEVETIRACETAM IN NACL 1000 MG/100ML IV SOLN
1000.0000 mg | Freq: Once | INTRAVENOUS | Status: AC
  Administered 2021-01-12: 1000 mg via INTRAVENOUS
  Filled 2021-01-12: qty 100

## 2021-01-12 MED ORDER — SODIUM CHLORIDE 0.9 % IV BOLUS
1000.0000 mL | Freq: Once | INTRAVENOUS | Status: AC
  Administered 2021-01-12: 1000 mL via INTRAVENOUS

## 2021-01-12 MED ORDER — METOPROLOL TARTRATE 25 MG PO TABS
25.0000 mg | ORAL_TABLET | Freq: Once | ORAL | Status: AC
  Administered 2021-01-12: 25 mg via ORAL
  Filled 2021-01-12: qty 1

## 2021-01-12 MED ORDER — SODIUM CHLORIDE 0.9 % IV SOLN: INTRAVENOUS | Status: DC

## 2021-01-12 NOTE — Discharge Instructions (Addendum)
  You were evaluated in the Emergency Department and after careful evaluation, we did not find any emergent condition requiring admission or further testing in the hospital.   Your exam/testing today was overall reassuring.  Symptoms seem to be due to seizures.  This is most likely because you missed your seizure medication.  Please take this as scheduled and follow-up with your neurologist.  This is extremely important.  If you have any new worsening concerning symptom please come back to the emergency department. Please return to the Emergency Department if you experience any worsening of your condition.  Thank you for allowing [place] to be a part of your care. Please speak to your pharmacist about any new medications prescribed today in regards to side effects or interactions with other medications.

## 2021-01-12 NOTE — ED Notes (Signed)
Colostomy bag changed by RN.

## 2021-01-12 NOTE — ED Triage Notes (Signed)
Patient arrived with EMS from home reports grand mal seizure this evening , he has not taken his Keppra for 2 days , no injury , denies pain /respirations unlabored, alert and oriented . CBG= 122.
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
## 2021-01-17 ENCOUNTER — Ambulatory Visit (INDEPENDENT_AMBULATORY_CARE_PROVIDER_SITE_OTHER): Payer: Medicaid Other | Admitting: Physical Therapy

## 2021-01-17 ENCOUNTER — Other Ambulatory Visit: Payer: Self-pay

## 2021-01-17 ENCOUNTER — Encounter: Payer: Self-pay | Admitting: Physical Therapy

## 2021-01-17 DIAGNOSIS — M25662 Stiffness of left knee, not elsewhere classified: Secondary | ICD-10-CM

## 2021-01-17 DIAGNOSIS — M25652 Stiffness of left hip, not elsewhere classified: Secondary | ICD-10-CM

## 2021-01-17 DIAGNOSIS — M6281 Muscle weakness (generalized): Secondary | ICD-10-CM

## 2021-01-17 DIAGNOSIS — R262 Difficulty in walking, not elsewhere classified: Secondary | ICD-10-CM
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
## 2021-01-19 ENCOUNTER — Encounter: Payer: Medicaid Other | Admitting: Physical Therapy

## 2021-01-19 ENCOUNTER — Telehealth: Payer: Self-pay | Admitting: Physical Therapy

## 2021-01-19 NOTE — Telephone Encounter (Signed)
Pt no show for PT appointment today. They were contacted and informed of this via voicemail. They were provided the date and time of their next appointment on voicemail. They were instructed to call us to let us know if they cannot make their appointment.  Ivery Quale, PT, DPT 01/19/21 8:28 AM

## 2021-01-24 ENCOUNTER — Other Ambulatory Visit: Payer: Self-pay

## 2021-01-24 ENCOUNTER — Ambulatory Visit (INDEPENDENT_AMBULATORY_CARE_PROVIDER_SITE_OTHER): Payer: Self-pay | Admitting: Student

## 2021-01-24 ENCOUNTER — Encounter: Payer: Self-pay | Admitting: Student

## 2021-01-24 ENCOUNTER — Encounter: Payer: Medicaid Other | Admitting: Physical Therapy

## 2021-01-24 DIAGNOSIS — Z0000 Encounter for general adult medical examination without abnormal findings: Secondary | ICD-10-CM

## 2021-01-24 DIAGNOSIS — G40909 Epilepsy, unspecified, not intractable, without status epilepticus: Secondary | ICD-10-CM

## 2021-01-24 DIAGNOSIS — S3661XA Primary blast injury of rectum, initial encounter: Secondary | ICD-10-CM

## 2021-01-24 DIAGNOSIS — S32009S Unspecified fracture of unspecified lumbar vertebra, sequela: Secondary | ICD-10-CM

## 2021-01-24 MED ORDER — OXYCODONE HCL 5 MG PO TABS: 10.0000 mg | ORAL_TABLET | Freq: Three times a day (TID) | ORAL | 0 refills | Status: DC | PRN

## 2021-01-24 MED ORDER — ACETAMINOPHEN 500 MG PO TABS: 1000.0000 mg | ORAL_TABLET | Freq: Four times a day (QID) | ORAL | 0 refills | Status: DC
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
## 2021-01-24 NOTE — Assessment & Plan Note (Signed)
Patient recently evaluated in the ED as he missed a dose of Keppra and had seizure-like activity.  He was restarted on the ED and was without complications.  Continue Keppra 500 mg twice daily

## 2021-01-24 NOTE — Assessment & Plan Note (Signed)
Patient continues to endorse left-sided leg pain.  He is doing well with physical therapy but notes that he is starting to get sensation back in the left lower extremity and that he is having increasing pain.  I do suspect patient will eventually need to follow-up with neurology as well as a pain clinic to better manage his pain.  We will continue him on his current regimen of gabapentin, Tylenol, oxycodone 10 mg 3 times daily.  Patient states that he is taking his oxycodone twice a day, 10 mg and that has prescription lasted him longer than it should have.  PDMP reviewed, appropriate.

## 2021-01-24 NOTE — Patient Instructions (Addendum)
Thank you, Mr.Isaak R Ikard for allowing [place] to provide your care today. Today we discussed   Pain  We will be refilling your pain medications today. Please call our clinic if you feel as though your pain is not well controlled.   I will be continuing to work with social work team to see what resources are available for you.   PLEASE CALL us IF YOU NEED ASSISTANCE WITH ANYTHING   I have ordered the following labs for you:  Lab Orders  No laboratory test(s) ordered today    Referrals ordered today:   Referral Orders  No referral(s) requested today     I have ordered the following medication/changed the following medications:   Stop the following medications: There are no discontinued medications.   Start the following medications: No orders of the defined types were placed in this encounter.    Follow up: 3 months    Remember: Reassess pain and financial barriers   Should you have any questions or concerns please call the internal medicine clinic at [phone].     Thalia Bloodgood, D.O. [facility]

## 2021-01-24 NOTE — Assessment & Plan Note (Signed)
Patient with many health care barriers currently as he is uninsured.  His mother is continuing to work on assisting the patient in getting approved for the orange card and Chemical engineer.  We will reach out to our social work team to see if there is anything they can do to assist the patient with.

## 2021-01-24 NOTE — Assessment & Plan Note (Signed)
Assessment: Patient states he is doing well in terms of his ostomy bags.  Does not appear as though as he has followed with general surgery.  Patient is concerned about running out of ostomy bags.  Plan: -We will reach out to ostomy clinic tomorrow to discuss options in terms of assisting patient with getting supplies.

## 2021-01-25 ENCOUNTER — Encounter: Payer: Medicaid Other | Admitting: Physical Therapy

## 2021-01-25 ENCOUNTER — Telehealth: Payer: Self-pay | Admitting: Physical Therapy

## 2021-01-25 ENCOUNTER — Telehealth: Payer: Self-pay

## 2021-01-25 ENCOUNTER — Telehealth: Payer: Self-pay | Admitting: *Deleted

## 2021-01-25 NOTE — Chronic Care Management (AMB) (Signed)
  Care Management   Outreach Note  01/25/2021 Name: Jonathan [name] MRN: [id number] DOB: 08/20/2000  Referred by: [name], MD Reason for referral : Care Coordination (Initial outreach to schedule referral with BSW)   An unsuccessful telephone outreach was attempted today. The patient was referred to the case management team for assistance with care management and care coordination.   Follow Up Plan: A HIPAA compliant phone message was left for the patient providing contact information and requesting a return call. The care management team will reach out to the patient again over the next 7 days.  If patient returns call to provider office, please advise to call Embedded Care Management Care Guide Gwenevere Ghazi at [phone].  Gwenevere Ghazi  Care Guide, Embedded Care Coordination [facility] Management

## 2021-01-25 NOTE — Telephone Encounter (Signed)
I called pt after he didn't show up for his 10:15 appointment today. Pt stated he forgot. Pt was reminded of his 8:00am appointment  tomorrow on 01/25/2021.   Narda Amber, PT, MPT 01/25/21 12:33 PM

## 2021-01-25 NOTE — Telephone Encounter (Signed)
  Care Management   Outreach Note  01/25/2021 Name: Jonathan [name] MRN: [id number] DOB: [birth year]  Referred by: [name], MD Reason for referral : No chief complaint on file.   An unsuccessful telephone outreach was attempted today. The patient was referred to the case management team for assistance with care management and care coordination. Patient needs assistance obtaining ostomy supplies.  Information to give him includes: [employer]  [phone]; option 6.  They will require demographics from the patient and financial information. They will do 3 months of supplies at a time and require the patient to contact them after 3 months to re-up the assistance. They will only supply basic supplies and many times this means pouches without filters that are clear (not beige).    Other potential options-   [employer]: [phone] www.https://richard.com/ (will send supplies for the shipping cost only) www.fowusa.org [employer] www.ostomyangels.com www.stomabags.com or 579-702-4174 supplies for non or uninsured   Follow Up Plan: Telephone follow up appointment with care management team member scheduled for: Message left for return call, RNCM will call again tomorrow if patient does not return call.  Jodelle Gross, RN, BSN, CCM Care Management Coordinator [facility] Internal Medicine Phone: [phone] / Fax: [phone]

## 2021-01-25 NOTE — Addendum Note (Signed)
Addended by: Belva Agee on: 01/25/2021 09:28 AM   Modules accepted: Orders

## 2021-01-26 ENCOUNTER — Telehealth: Payer: Medicaid Other

## 2021-01-26 ENCOUNTER — Encounter: Payer: Medicaid Other | Admitting: Physical Therapy

## 2021-01-31 ENCOUNTER — Other Ambulatory Visit: Payer: Self-pay

## 2021-01-31 ENCOUNTER — Encounter: Payer: Medicaid Other | Admitting: Physical Therapy

## 2021-01-31 ENCOUNTER — Encounter: Payer: Self-pay | Admitting: Rehabilitative and Restorative Service Providers"

## 2021-01-31 ENCOUNTER — Ambulatory Visit (INDEPENDENT_AMBULATORY_CARE_PROVIDER_SITE_OTHER): Payer: Self-pay | Admitting: Rehabilitative and Restorative Service Providers"

## 2021-01-31 DIAGNOSIS — M6281 Muscle weakness (generalized): Secondary | ICD-10-CM

## 2021-01-31 DIAGNOSIS — M25662 Stiffness of left knee, not elsewhere classified: Secondary | ICD-10-CM

## 2021-01-31 DIAGNOSIS — M25652 Stiffness of left hip, not elsewhere classified: Secondary | ICD-10-CM

## 2021-01-31 DIAGNOSIS — R262 Difficulty in walking, not elsewhere classified: Secondary | ICD-10-CM

## 2021-01-31 NOTE — Chronic Care Management (AMB) (Signed)
  Care Management   Note  01/31/2021 Name: Jonathan [name] MRN: [id number] DOB: April 12, 2001  Jonathan [name] is a 20 y.o. year old male who is a primary care patient of Agyei, Hermina Staggers, MD. I reached out to [name] by phone today in response to a referral sent by Jonathan [name] PCP, [name], MD.   Jonathan [name] was given information about care management services today including:  Care management services include personalized support from designated clinical staff supervised by his physician, including individualized plan of care and coordination with other care providers 24/7 contact phone numbers for assistance for urgent and routine care needs. The patient may stop care management services at any time by phone call to the office staff.  Patient agreed to services and verbal consent obtained.   Follow up plan: Telephone appointment with care management team member scheduled for:02/01/21  [facility] Guide, Embedded Care Coordination [facility]  Care Management  Direct Dial: [phone]
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
## 2021-02-01 ENCOUNTER — Ambulatory Visit: Payer: Self-pay | Admitting: Licensed Clinical Social Worker

## 2021-02-01 NOTE — Chronic Care Management (AMB) (Signed)
  Care Management   Social Work Visit Note  02/01/2021 Name: Jonathan [name] MRN: [id number] DOB: [birth year]  Jonathan [name] is a 20 y.o. year old male who sees [name], MD for primary care. The care management team was consulted for assistance with care management and care coordination needs related to [facility] Resources    Patient was given the following information about care management and care coordination services today, agreed to services, and gave verbal consent: 1.care management/care coordination services include personalized support from designated clinical staff supervised by their physician, including individualized plan of care and coordination with other care providers 2. 24/7 contact phone numbers for assistance for urgent and routine care needs. 3. The patient may stop care management/care coordination services at any time by phone call to the office staff.  Engaged with patient by telephone for initial visit in response to provider referral for social work chronic care management and care coordination services.  Assessment: Review of patient history, allergies, and health status during evaluation of patient need for care management/care coordination services.    Interventions:  Patient interviewed and appropriate assessments performed Collaborated with clinical team regarding patient needs  Patient declined needing assistance. Patient stated he received forms regarding assistance with finances. Patient advised he is reaching out to a Artist.  Sw offered to assist while he seeks assistance, but patient declined SW services.  SW gave patient SW contact information if assistance is needed.  SW completed an SDOH screening and no additional needs or interventions were needed at this time.   SDOH (Social Determinants of Health) assessments performed: Yes     Plan:  Per patient request, SW will remove herself from further contact with patient. Patient has  SW contact information.   [name], BSW  Social Worker [facility] Care Management  [phone]

## 2021-02-01 NOTE — Patient Instructions (Signed)
Visit Information  Instructions:   Patient was given the following information about care management and care coordination services today, agreed to services, and gave verbal consent: 1.care management/care coordination services include personalized support from designated clinical staff supervised by their physician, including individualized plan of care and coordination with other care providers 2. 24/7 contact phone numbers for assistance for urgent and routine care needs. 3. The patient may stop care management/care coordination services at any time by phone call to the office staff.  Patient verbalizes understanding of instructions provided today and agrees to view in MyChart.   Plan:  Per patient request, SW will remove herself from further contact with patient. Patient has SW contact information.   [name], BSW  Social Worker [facility] Care Management  [phone]

## 2021-02-02 ENCOUNTER — Encounter: Payer: Self-pay | Admitting: Rehabilitative and Restorative Service Providers"

## 2021-02-02 ENCOUNTER — Ambulatory Visit (INDEPENDENT_AMBULATORY_CARE_PROVIDER_SITE_OTHER): Payer: Self-pay | Admitting: Rehabilitative and Restorative Service Providers"

## 2021-02-02 ENCOUNTER — Encounter: Payer: Medicaid Other | Admitting: Physical Therapy

## 2021-02-02 ENCOUNTER — Other Ambulatory Visit: Payer: Self-pay

## 2021-02-02 DIAGNOSIS — M6281 Muscle weakness (generalized): Secondary | ICD-10-CM

## 2021-02-02 DIAGNOSIS — R262 Difficulty in walking, not elsewhere classified: Secondary | ICD-10-CM

## 2021-02-02 DIAGNOSIS — M25662 Stiffness of left knee, not elsewhere classified: Secondary | ICD-10-CM

## 2021-02-02 DIAGNOSIS — M25652 Stiffness of left hip, not elsewhere classified: Secondary | ICD-10-CM
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
## 2021-02-06 NOTE — Progress Notes (Signed)
Internal Medicine Clinic Attending  Case discussed with Dr. Katsadouros  At the time of the visit.  We reviewed the resident's history and exam and pertinent patient test results.  I agree with the assessment, diagnosis, and plan of care documented in the resident's note.  

## 2021-02-07 ENCOUNTER — Encounter: Payer: Medicaid Other | Admitting: Physical Therapy

## 2021-02-07 ENCOUNTER — Ambulatory Visit (INDEPENDENT_AMBULATORY_CARE_PROVIDER_SITE_OTHER): Payer: Self-pay | Admitting: Physical Therapy

## 2021-02-07 ENCOUNTER — Other Ambulatory Visit: Payer: Self-pay

## 2021-02-07 ENCOUNTER — Encounter: Payer: Self-pay | Admitting: Physical Therapy

## 2021-02-07 DIAGNOSIS — M25652 Stiffness of left hip, not elsewhere classified: Secondary | ICD-10-CM

## 2021-02-07 DIAGNOSIS — M6281 Muscle weakness (generalized): Secondary | ICD-10-CM

## 2021-02-07 DIAGNOSIS — R262 Difficulty in walking, not elsewhere classified: Secondary | ICD-10-CM

## 2021-02-07 DIAGNOSIS — M25662 Stiffness of left knee, not elsewhere classified: Secondary | ICD-10-CM
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
## 2021-02-08 ENCOUNTER — Other Ambulatory Visit: Payer: Self-pay

## 2021-02-08 ENCOUNTER — Encounter: Payer: Medicaid Other | Admitting: Rehabilitative and Restorative Service Providers"

## 2021-02-08 NOTE — Telephone Encounter (Signed)
acetaminophen (TYLENOL) 500 MG tablet  gabapentin (NEURONTIN) 400 MG capsule, refill request @ [facility] [phone] - [place], [place] - [address] AT [facility] [address]

## 2021-02-09 ENCOUNTER — Encounter: Payer: Medicaid Other | Admitting: Physical Therapy

## 2021-02-09 MED ORDER — GABAPENTIN 400 MG PO CAPS: 400.0000 mg | ORAL_CAPSULE | Freq: Three times a day (TID) | ORAL | 0 refills | Status: DC

## 2021-02-09 MED ORDER — ACETAMINOPHEN 500 MG PO TABS: 1000.0000 mg | ORAL_TABLET | Freq: Four times a day (QID) | ORAL | 0 refills | Status: DC

## 2021-02-09 NOTE — Telephone Encounter (Signed)
Patient's mom came to clinic requesting these refills today if possible. States patient has PT again tomorrow and these meds help more than oxycodone.

## 2021-02-10 ENCOUNTER — Encounter: Payer: Self-pay | Admitting: Rehabilitative and Restorative Service Providers"

## 2021-02-10 ENCOUNTER — Ambulatory Visit (INDEPENDENT_AMBULATORY_CARE_PROVIDER_SITE_OTHER): Payer: Self-pay | Admitting: Rehabilitative and Restorative Service Providers"

## 2021-02-10 ENCOUNTER — Other Ambulatory Visit: Payer: Self-pay

## 2021-02-10 DIAGNOSIS — M6281 Muscle weakness (generalized): Secondary | ICD-10-CM

## 2021-02-10 DIAGNOSIS — R262 Difficulty in walking, not elsewhere classified: Secondary | ICD-10-CM

## 2021-02-10 DIAGNOSIS — M25652 Stiffness of left hip, not elsewhere classified: Secondary | ICD-10-CM

## 2021-02-10 DIAGNOSIS — M25662 Stiffness of left knee, not elsewhere classified: Secondary | ICD-10-CM
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
## 2021-02-10 NOTE — Patient Instructions (Signed)
Focus on hip flexors stretch, bridging and straightening the knee with HEP minimum 5X/day

## 2021-02-14 ENCOUNTER — Encounter: Payer: Medicaid Other | Admitting: Physical Therapy

## 2021-02-14 ENCOUNTER — Other Ambulatory Visit: Payer: Self-pay | Admitting: *Deleted

## 2021-02-14 ENCOUNTER — Other Ambulatory Visit: Payer: Self-pay | Admitting: Student

## 2021-02-14 MED ORDER — LEVETIRACETAM 500 MG PO TABS: 500.0000 mg | ORAL_TABLET | Freq: Two times a day (BID) | ORAL | 0 refills | Status: DC

## 2021-02-14 MED ORDER — METHOCARBAMOL 500 MG PO TABS: 1000.0000 mg | ORAL_TABLET | Freq: Four times a day (QID) | ORAL | 0 refills | Status: DC | PRN

## 2021-02-14 MED ORDER — METOPROLOL TARTRATE 25 MG PO TABS: 25.0000 mg | ORAL_TABLET | Freq: Two times a day (BID) | ORAL | 0 refills | Status: DC

## 2021-02-14 MED ORDER — ACETAMINOPHEN 500 MG PO TABS: 1000.0000 mg | ORAL_TABLET | Freq: Four times a day (QID) | ORAL | 0 refills | Status: DC

## 2021-02-14 NOTE — Telephone Encounter (Signed)
Refill Request  levETIRAcetam (KEPPRA) 500 MG tablet   metoprolol tartrate metoprolol tartrate (LOPRESSOR) 25 MG tablet  Take 1 tablet (25 mg total) by mouth 2 (two) times daily., Starting Tue 11/22/2020, Normal  Dispense: 60 tablet  Refills: 0 ordered  Pharmacy: [id number] [place], [place] - [address] (Ph: [phone])

## 2021-02-14 NOTE — Telephone Encounter (Signed)
Call from pt's mother stating the pt needs a refill on his seizure medication. Informed it has been refilled and [facility] to [facility] (as requested on previous encounter). She stated all of his meds suppose to [facility] to [facility] which she had [facility] before several times. Informed I will take [facility] off [facility]'s chart. Stated she will pick up Keppra at [facility] this time.

## 2021-02-16 ENCOUNTER — Other Ambulatory Visit: Payer: Self-pay | Admitting: *Deleted

## 2021-02-16 ENCOUNTER — Encounter: Payer: Medicaid Other | Admitting: Physical Therapy

## 2021-02-16 MED ORDER — GABAPENTIN 400 MG PO CAPS: 400.0000 mg | ORAL_CAPSULE | Freq: Three times a day (TID) | ORAL | 3 refills | Status: DC

## 2021-02-16 NOTE — Telephone Encounter (Signed)
Received fax from pharmacy with the following message below:   Advance Refill Approval Request  There are zero refills remaining on this prescription. Your patient is requesting advance approval of refills for this medication to PREVENT ANY MISSED DOSES. (Note:Prescription will be refilled when due)

## 2021-02-21 ENCOUNTER — Encounter: Payer: Medicaid Other | Admitting: Physical Therapy

## 2021-02-21 ENCOUNTER — Encounter: Payer: Self-pay | Admitting: Rehabilitative and Restorative Service Providers"

## 2021-02-21 ENCOUNTER — Other Ambulatory Visit: Payer: Self-pay

## 2021-02-21 ENCOUNTER — Ambulatory Visit (INDEPENDENT_AMBULATORY_CARE_PROVIDER_SITE_OTHER): Payer: Self-pay | Admitting: Rehabilitative and Restorative Service Providers"

## 2021-02-21 DIAGNOSIS — M25652 Stiffness of left hip, not elsewhere classified: Secondary | ICD-10-CM

## 2021-02-21 DIAGNOSIS — M25662 Stiffness of left knee, not elsewhere classified: Secondary | ICD-10-CM

## 2021-02-21 DIAGNOSIS — R262 Difficulty in walking, not elsewhere classified: Secondary | ICD-10-CM

## 2021-02-21 DIAGNOSIS — M6281 Muscle weakness (generalized): Secondary | ICD-10-CM
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
## 2021-02-23 ENCOUNTER — Encounter: Payer: Medicaid Other | Admitting: Physical Therapy

## 2021-02-24 ENCOUNTER — Encounter: Payer: Medicaid Other | Admitting: Rehabilitative and Restorative Service Providers"

## 2021-02-28 ENCOUNTER — Encounter: Payer: Medicaid Other | Admitting: Rehabilitative and Restorative Service Providers"

## 2021-02-28 ENCOUNTER — Encounter: Payer: Medicaid Other | Admitting: Physical Therapy

## 2021-03-02 ENCOUNTER — Encounter: Payer: Self-pay | Admitting: Student

## 2021-03-02 ENCOUNTER — Encounter: Payer: Medicaid Other | Admitting: Physical Therapy

## 2021-03-02 ENCOUNTER — Ambulatory Visit (INDEPENDENT_AMBULATORY_CARE_PROVIDER_SITE_OTHER): Payer: Self-pay | Admitting: Student

## 2021-03-02 ENCOUNTER — Other Ambulatory Visit: Payer: Self-pay

## 2021-03-02 VITALS — BP 115/57 | HR 100 | Temp 98.3°F | Ht 71.0 in

## 2021-03-02 DIAGNOSIS — G40909 Epilepsy, unspecified, not intractable, without status epilepticus: Secondary | ICD-10-CM

## 2021-03-02 DIAGNOSIS — S3661XA Primary blast injury of rectum, initial encounter: Secondary | ICD-10-CM

## 2021-03-02 MED ORDER — OXYCODONE HCL 10 MG PO TABS: 10.0000 mg | ORAL_TABLET | Freq: Three times a day (TID) | ORAL | 0 refills | Status: DC | PRN
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
## 2021-03-02 NOTE — Patient Instructions (Signed)
Jonathan [name],  It was a pleasure seeing you in the clinic today.  I am sorry that you still in so much pain.  I have sent a prescription of oxycodone 10 mg tablet to [facility].  You can take up to 3 tablets a day.  Please also take 2 tablets of Tylenol 500 mg with the oxycodone.  Continue the muscle relaxant and gabapentin for nerve pain.  Once you obtain coverage, we will refer you to a pain specialist.  Take care,  Dr. Cyndie Chime
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
## 2021-03-02 NOTE — Assessment & Plan Note (Signed)
Ostomy bag appears clean and noninfected.  Patient report no issue with changing the back.

## 2021-03-02 NOTE — Assessment & Plan Note (Signed)
Continue Keppra.

## 2021-03-03 ENCOUNTER — Encounter: Payer: Medicaid Other | Admitting: Rehabilitative and Restorative Service Providers"

## 2021-03-07 ENCOUNTER — Encounter: Payer: Medicaid Other | Admitting: Physical Therapy

## 2021-03-08 ENCOUNTER — Other Ambulatory Visit: Payer: Self-pay | Admitting: Student

## 2021-03-09 ENCOUNTER — Encounter: Payer: Medicaid Other | Admitting: Physical Therapy

## 2021-03-23 ENCOUNTER — Telehealth: Payer: Self-pay | Admitting: Student

## 2021-03-23 NOTE — Telephone Encounter (Signed)
Refill Request   gabapentin (NEURONTIN) 400 MG capsule  [facility] [phone] - [place], [place] - [phone] [address] AT [facility] [address] (Ph: [phone]

## 2021-03-23 NOTE — Telephone Encounter (Signed)
#  90 with 3 refills sent to [facility] on 02/16/21. Patient notified to contact Pharmacy for refill. He will do that now.

## 2021-04-06 ENCOUNTER — Telehealth: Payer: Self-pay | Admitting: Student

## 2021-04-06 NOTE — Telephone Encounter (Signed)
Refill Request   Oxycodone HCl 10 MG TABS  [id number] - [place], [place] - [address] AT [address] (Ph: [phone]) 

## 2021-04-07 ENCOUNTER — Other Ambulatory Visit: Payer: Self-pay | Admitting: Student

## 2021-04-07 DIAGNOSIS — S3661XA Primary blast injury of rectum, initial encounter: Secondary | ICD-10-CM

## 2021-04-07 MED ORDER — OXYCODONE HCL 10 MG PO TABS: 10.0000 mg | ORAL_TABLET | Freq: Three times a day (TID) | ORAL | 0 refills | Status: DC | PRN

## 2021-04-07 NOTE — Progress Notes (Signed)
Patient requesting refill of oxycodone 10 mg. PDMP reviewed and appropriate. Will refill for 30 day supply. Will also reach out to scheduling so patient can have appointment with me in November when I am back in clinic.

## 2021-04-07 NOTE — Telephone Encounter (Signed)
Patient called back stating he needs this med by today. Will route to Attending pool.

## 2021-05-01 ENCOUNTER — Ambulatory Visit: Payer: Self-pay

## 2021-05-01 NOTE — Chronic Care Management (AMB) (Signed)
  Chronic Care Management   Note  05/01/2021 Name: Jonathan [name] MRN: [id number] DOB: [birth year]    Follow up plan: Received phone call from patients Mother stating her son was running out of Ostomy supplies and they need assistance.  This RNCM called patient and verified with 2 identifiers.  Patient gave verbal permission to speak with his Mother.  Return call placed to [employer] at [phone] who noted patient uses [facility] [phone] and ring [phone] ostomy supplies.  Patient does not have insurance and has difficulty getting ostomy supplies.  Discussion in July with PCP and provided him with the following numbers for assistance which was given to the family.    [employer]  [phone]; option 6.    They will do 3 months of supplies at a time and require the patient to contact them after 3 months to re-up the assistance.  Keep in mind they only supply basic supplies and many times this means pouches without filters that are clear (not beige).    [employer]: 604-032-7842 www.https://richard.com/ (will send supplies for the shipping cost only)  * www.fowusa.org [employer]  * www.ostomyangels.com  * www.stomabags.com or (825)777-1315 supplies for non or uninsured   Patient's Mother noted they lost the phone numbers and needed the information again.  Patient was called by [name],  BSW to assist with Medicaid application in July and he requested not to be contacted again.  Provided information to patient's mother and verified patient's follow up on May 09, 2021 with PCP.  Jodelle Gross, RN, BSN, CCM Care Management Coordinator [facility] Internal Medicine Phone: [phone] / Fax: [phone]

## 2021-05-05 ENCOUNTER — Other Ambulatory Visit: Payer: Self-pay | Admitting: Student

## 2021-05-05 DIAGNOSIS — S3661XA Primary blast injury of rectum, initial encounter: Secondary | ICD-10-CM

## 2021-05-05 MED ORDER — OXYCODONE HCL 10 MG PO TABS: 10.0000 mg | ORAL_TABLET | Freq: Three times a day (TID) | ORAL | 0 refills | Status: DC | PRN

## 2021-05-05 NOTE — Telephone Encounter (Signed)
Last rx written  04/07/21. Last OV  03/02/21. Next OV  05/09/21 with PCP. Drug screen 01/12/21.

## 2021-05-05 NOTE — Telephone Encounter (Signed)
Refill Request   Oxycodone HCl 10 MG TABS  [facility] [phone] - [place], [place] - [phone] [facility] [address] AT [facility] [address] (Ph: [phone])

## 2021-05-05 NOTE — Telephone Encounter (Signed)
Next appt scheduled 05/09/21 with PCP. 

## 2021-05-09 ENCOUNTER — Ambulatory Visit: Payer: Self-pay

## 2021-05-09 ENCOUNTER — Encounter: Payer: Self-pay | Admitting: Student

## 2021-05-09 ENCOUNTER — Ambulatory Visit (INDEPENDENT_AMBULATORY_CARE_PROVIDER_SITE_OTHER): Payer: Self-pay | Admitting: Student

## 2021-05-09 VITALS — BP 110/62 | HR 86 | Temp 98.0°F

## 2021-05-09 DIAGNOSIS — G40909 Epilepsy, unspecified, not intractable, without status epilepticus: Secondary | ICD-10-CM

## 2021-05-09 DIAGNOSIS — M24562 Contracture, left knee: Secondary | ICD-10-CM

## 2021-05-09 DIAGNOSIS — S32009S Unspecified fracture of unspecified lumbar vertebra, sequela: Secondary | ICD-10-CM

## 2021-05-09 DIAGNOSIS — S3661XA Primary blast injury of rectum, initial encounter: Secondary | ICD-10-CM

## 2021-05-09 MED ORDER — LEVETIRACETAM 500 MG PO TABS: 500.0000 mg | ORAL_TABLET | Freq: Two times a day (BID) | ORAL | 0 refills | Status: DC

## 2021-05-09 NOTE — Patient Instructions (Signed)
Thank you, Jonathan [name] for allowing [place] to provide your care today. Today we discussed .  Follow-up appointments I will be placing referrals for the orthopedic surgeon, general surgeon, neurologist, physical therapist.  I will also be reaching out to our social work team who can help you with disability further.  I will work with our team to see what we can do about home health with your current coverage.  I will be filling out your handicap paperwork sometime in the next few days.  The office will call you when this is ready for pickup.  Our team is working to help you get no rings.  If you ever have difficulties with your medications please call our clinic or send me a MyChart message.  I have ordered the following labs for you:  Lab Orders  No laboratory test(s) ordered today     Tests ordered today:  None  Referrals ordered today:  General surgery Orthopedic surgery Physical therapy Neurology  I have ordered the following medication/changed the following medications:   Stop the following medications: Medications Discontinued During This Encounter  Medication Reason   levETIRAcetam (KEPPRA) 500 MG tablet    metoprolol tartrate (LOPRESSOR) 25 MG tablet      Start the following medications: Meds ordered this encounter  Medications   levETIRAcetam (KEPPRA) 500 MG tablet    Sig: Take 1 tablet (500 mg total) by mouth 2 (two) times daily.    Dispense:  60 tablet    Refill:  0     Follow up:  End of the month to see me     Should you have any questions or concerns please call the internal medicine clinic at [phone].    Thalia Bloodgood, D.O. [facility]
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
## 2021-05-09 NOTE — Assessment & Plan Note (Addendum)
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  He states that working with physical therapy helped to some however he felt as though doing exercise at home by himself has improved this more.  I believe he would benefit from an orthopedic surgery referral at this time to discuss possible procedures to help with this.  States that the oxycodone as well as Tylenol helped manage his pain and help him function on a day-to-day basis.  We will continue with this.  Patient now has the CAFA letter as well as orange card.  We will send referrals to orthopedic surgery, general surgery, physical therapy.  Mother states patient has been denied for disability.  We will have her work with our social work team, in the past it appears as though Jonathan [name] denied their services earlier this year.  Have also filled out a temporary disability parking place card for 6 months.  Plan: -Referral placed for orthopedic surgery, general surgery, physical therapy, social work -Disability parking place card paperwork filled out -Continue pain management with oxycodone 10 3 times daily and gabapentin 400 every 8 hours

## 2021-05-09 NOTE — Assessment & Plan Note (Signed)
Assessment: Patient states he has been adherent to his Keppra 500 mg twice daily.  However he states that he was recently dealing with high amounts of stress 3 months ago and had seizure-like activity.  During the event he had shaking and extension of both of his legs.  Patient denies following up with provider after this.  We will continue him on his current regimen of Keppra 500 mg twice daily and have him see neurology.  He has not seen them in some time to be being uninsured.  Plan: -Continue Keppra 500 mg twice daily -Follow-up with neurology

## 2021-05-10 ENCOUNTER — Telehealth: Payer: Self-pay | Admitting: *Deleted

## 2021-05-10 NOTE — Telephone Encounter (Signed)
   Telephone encounter was:  Unsuccessful.  05/10/2021 Name: Jonathan [name] MRN: [id number] DOB: 2001-07-06  Unsuccessful outbound call made today to assist with:   disability  Outreach Attempt:  1st Attempt  A HIPAA compliant voice message was left requesting a return call.  Instructed patient to call back at   Instructed patient to call back at [phone]  at their earliest convenience. Yehuda Mao Greenauer -[facility] Guide , Embedded Care Coordination [facility], Care Management  [phone] [address] [place] , [place] [address] Email : [email or web address]

## 2021-05-10 NOTE — Progress Notes (Signed)
Internal Medicine Clinic Attending  I saw and evaluated the patient.  I personally confirmed the key portions of the history and exam documented by Dr. Katsadouros and I reviewed pertinent patient test results.  The assessment, diagnosis, and plan were formulated together and I agree with the documentation in the resident's note.  

## 2021-05-10 NOTE — Chronic Care Management (AMB) (Signed)
  Care Management   Note  05/10/2021 Name: Jonathan [name] MRN: [id number] DOB: January 01, 2001  Jonathan [name] is a 20 y.o. year old male who is a primary care patient of Katsadouros, Heidi Dach, MD. I reached out to [name] by phone today in response to a referral sent by [name]'s primary care provider.   [name] was given information about care management services today including:  Care management services include personalized support from designated clinical staff supervised by his physician, including individualized plan of care and coordination with other care providers 24/7 contact phone numbers for assistance for urgent and routine care needs. The patient may stop care management services at any time by phone call to the office staff.  Patient agreed to services and verbal consent obtained.   Follow up plan: Telephone appointment with care management team member scheduled for:05/15/21  [facility] Guide, Embedded Care Coordination [facility]  Care Management  Direct Dial: [phone]

## 2021-05-10 NOTE — Chronic Care Management (AMB) (Signed)
  Chronic Care Management   Note  05/09/2021 Name: Jonathan [name] MRN: [id number] DOB: [birth year]    Follow up plan: Dr. Evie Lacks and this RNCM collaborated on patient while he was in the clinic.  This RNCM explained to PCP I had provided phone number to patient's Mom for Ostomy supplies for patient's without insurance.  PCP noted the patient had received his [place] card but was denied for disability and Medicaid.  Explained to PCP that the patient had told [name], our BSW that he did not want her to contact him again.  Marena Chancy would be the best person to assist the patient with appealing these decisions.  PCP to speak with patient about connecting with our BSW.  This RNCM went to see patient and his parents, introduced myself and provided business cards to all of them in case they need any assistance.  Jodelle Gross, RN, BSN, CCM Care Management Coordinator [facility] Internal Medicine Phone: [phone]/Fax: [phone]

## 2021-05-11 ENCOUNTER — Telehealth: Payer: Self-pay | Admitting: *Deleted

## 2021-05-11 NOTE — Telephone Encounter (Signed)
   Telephone encounter was:  Unsuccessful.  05/11/2021 Name: Jonathan [name] MRN: [id number] DOB: [birth year]  Unsuccessful outbound call made today to assist with:   disability  Outreach Attempt:  2nd Attempt  A HIPAA compliant voice message was left requesting a return call.  Instructed patient to call back at   Instructed patient to call back at [phone]  at their earliest convenience. Yehuda Mao Greenauer -[facility] Guide , Embedded Care Coordination [facility], Care Management  [phone] [address] [place] , [place] [address] Email : [email or web address]

## 2021-05-12 ENCOUNTER — Telehealth: Payer: Self-pay | Admitting: *Deleted

## 2021-05-12 NOTE — Telephone Encounter (Signed)
   Telephone encounter was:  Successful.  05/12/2021 Name: Jonathan [name] MRN: [id number] DOB: 11-May-2001  Jonathan [name] is a 20 y.o. year old male who is a primary care patient of Katsadouros, Vasilios, MD . The community resource team was consulted for assistance with Call dropped or was disconnected as patient did not seem to interestd in talking so returned call and left a message to return my call if he was interested in community resources this the 3x call back .  Care guide performed the following interventions: Patient provided with information about care guide support team and interviewed to confirm resource needs Follow up call placed to community resources to determine status of patients referral.  Follow Up Plan:  No further follow up planned at this time. The patient has been provided with needed resources. Jonathan [name] -[facility] Guide , Embedded Care Coordination [facility], Care Management  [phone] [address] [place] , [place] [address] Email : [email or web address]

## 2021-05-15 ENCOUNTER — Ambulatory Visit: Payer: Medicaid Other | Admitting: Licensed Clinical Social Worker

## 2021-05-17 ENCOUNTER — Ambulatory Visit (INDEPENDENT_AMBULATORY_CARE_PROVIDER_SITE_OTHER): Payer: Self-pay | Admitting: Orthopedic Surgery

## 2021-05-17 ENCOUNTER — Other Ambulatory Visit: Payer: Self-pay

## 2021-05-17 DIAGNOSIS — S21232A Puncture wound without foreign body of left back wall of thorax without penetration into thoracic cavity, initial encounter: Secondary | ICD-10-CM

## 2021-05-20 ENCOUNTER — Encounter: Payer: Self-pay | Admitting: Orthopedic Surgery
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
## 2021-05-24 ENCOUNTER — Ambulatory Visit: Payer: Self-pay | Admitting: Neurology

## 2021-05-24 ENCOUNTER — Encounter: Payer: Self-pay | Admitting: Neurology

## 2021-05-24 NOTE — Addendum Note (Signed)
Addended by: Belva Agee on: 05/24/2021 09:41 AM   Modules accepted: Orders

## 2021-05-25 NOTE — Addendum Note (Signed)
Addended by: Belva Agee on: 05/25/2021 08:36 AM   Modules accepted: Orders

## 2021-05-25 NOTE — Chronic Care Management (AMB) (Signed)
  Care Management   Social Work Visit Note  05/15/2021 Name: Jonathan [name] MRN: [id number] DOB: March 17, 2001  Jonathan [name] is a 20 y.o. year old male who sees Belva Agee, MD for primary care. The care management team was consulted for assistance with care management and care coordination needs related to [facility] Resources    Patient was given the following information about care management and care coordination services today, agreed to services, and gave verbal consent: 1.care management/care coordination services include personalized support from designated clinical staff supervised by their physician, including individualized plan of care and coordination with other care providers 2. 24/7 contact phone numbers for assistance for urgent and routine care needs. 3. The patient may stop care management/care coordination services at any time by phone call to the office staff.  Engaged with patient by telephone for initial visit in response to provider referral for social work chronic care management and care coordination services.  Assessment: Review of patient history, allergies, and health status during evaluation of patient need for care management/care coordination services.    Interventions:  Patient interviewed and appropriate assessments performed Collaborated with clinical team regarding patient needs  Spoke with patients mother- Occupational hygienist. Ms. Stiff stated she is in the process of appealing patients disability application and she has hired an Pensions consultant.  Patient now has an orange card which expires April 2023. Patient in need of handicap stickers.  Patient inquired on [facility] services.  SDOH (Social Determinants of Health) assessments performed: Yes     Plan:  SW will follow up on; PCS services, handicap stickers and disability application.  [name], BSW  Social Worker [facility] Care Management  [phone]

## 2021-05-25 NOTE — Patient Instructions (Signed)
Visit Information  Instructions: patient will work with SW to address concerns related to [facility] services, disability  Patient was given the following information about care management and care coordination services today, agreed to services, and gave verbal consent: 1.care management/care coordination services include personalized support from designated clinical staff supervised by their physician, including individualized plan of care and coordination with other care providers 2. 24/7 contact phone numbers for assistance for urgent and routine care needs. 3. The patient may stop care management/care coordination services at any time by phone call to the office staff.  Patient verbalizes understanding of instructions provided today and agrees to view in MyChart.   The care management team will reach out to the patient again over the next 30 days.   [name], BSW  Social Worker [facility] Care Management  [phone]

## 2021-05-26 ENCOUNTER — Ambulatory Visit: Payer: Self-pay

## 2021-05-26 NOTE — Chronic Care Management (AMB) (Signed)
   05/26/2021  Jonathan [name] [birth year] [id number]  Received message from [name], BSW that patient's mother [name] had lost the phone numbers this RNCM provided her for [facility] Customer assistance program.  Provided her with the following information and sent patient the information via my chart.  [employer]  [phone]; option 6.    They will do 3 months of supplies at a time and require the patient to contact them after 3 months to re-up the assistance.  Keep in mind they only supply basic supplies and many times this means pouches without filters that are clear (not beige).    [employer]: 980 144 4366 www.https://richard.com/ (will send supplies for the shipping cost only)  * www.fowusa.org [employer]  * www.ostomyangels.com  * www.stomabags.com or (814)410-8580 supplies for non or uninsured   Jodelle Gross, RN, BSN, CCM Care Management Coordinator [facility] Internal Medicine Phone: [phone]/Fax: [phone]

## 2021-05-30 ENCOUNTER — Encounter: Payer: Self-pay | Admitting: Student

## 2021-05-30 ENCOUNTER — Telehealth (HOSPITAL_BASED_OUTPATIENT_CLINIC_OR_DEPARTMENT_OTHER): Payer: Self-pay | Admitting: Physical Therapy

## 2021-05-30 NOTE — Addendum Note (Signed)
Addended by: Belva Agee on: 05/30/2021 05:56 PM   Modules accepted: Orders

## 2021-05-30 NOTE — Telephone Encounter (Signed)
Spoke with pt about no further need for skilled therapy at this time. Pt understands and agrees.  Discussed trying to find option for splinting/dynasplint for pt. PT has reached out of [employer] and will keep pt updated as more information becomes available. Pt agreeable to this course of action.   Zebedee Iba PT, DPT 05/30/21 2:22 PM

## 2021-06-05 ENCOUNTER — Other Ambulatory Visit: Payer: Self-pay | Admitting: Student

## 2021-06-06 ENCOUNTER — Other Ambulatory Visit: Payer: Self-pay | Admitting: Internal Medicine

## 2021-06-06 DIAGNOSIS — S3661XA Primary blast injury of rectum, initial encounter: Secondary | ICD-10-CM

## 2021-06-06 MED ORDER — OXYCODONE HCL 10 MG PO TABS: 10.0000 mg | ORAL_TABLET | Freq: Three times a day (TID) | ORAL | 0 refills | Status: DC | PRN

## 2021-06-06 NOTE — Telephone Encounter (Signed)
LOV- 05/09/2021  Last UA was 01/12/2021.  No future appointment .  Last filled 10/282022

## 2021-06-26 ENCOUNTER — Ambulatory Visit: Payer: Self-pay

## 2021-06-26 NOTE — Chronic Care Management (AMB) (Addendum)
° °  06/26/2021  Jonathan [name] January 24, 2001 [id number]  Incoming call from patient's Mother who is very concerned her son is "fading away" and she wanted to know why they haven't heard anything on appointments for him.  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  Per Zebedee Iba, PT he has contacted the vendor about Dyna Splinting him and patent should be hearing from them soon. Message sent to PCP Katsadouros to see if he had spoken with anyone at [facility] re: pediatric surgery.  Patient does not have Medicaid, the family is waiting on disability. Pending response from PCP. Addendum: Received response from Dr. Evie Lacks who noted patient was actually rejected by [facility] and he is currently working on his appeal letter right now. We are still trying to get him in with PM&R. Call placed to patients mother at [phone] to update her on progress. Jodelle Gross, RN, BSN, CCM Care Management Coordinator [facility] Internal Medicine Phone: [phone]/Fax: [phone]

## 2021-06-27 ENCOUNTER — Encounter: Payer: Self-pay | Admitting: Physical Medicine and Rehabilitation

## 2021-07-05 ENCOUNTER — Ambulatory Visit: Payer: Medicaid Other | Admitting: Licensed Clinical Social Worker

## 2021-07-05 ENCOUNTER — Ambulatory Visit: Payer: Self-pay

## 2021-07-05 NOTE — Chronic Care Management (AMB) (Signed)
° °  07/05/2021  Jonathan [name] October 19, 2000 [id number]   Received message from [name], BSW that patient wanted to speak with this RNCM about supplies.  Called patient and verified with two identifiers.  Patient was looking for information on how to get more ostomy supplies. Provided patient information;    [facility]  [phone]; option 6.    They will do 3 months of supplies at a time and require the patient to contact them after 3 months to re-up the assistance.  Keep in mind they only supply basic supplies and many times this means pouches without filters that are clear (not beige).    [employer]: 6785057345 www.https://richard.com/ (will send supplies for the shipping cost only)  * www.fowusa.org [employer]  * www.ostomyangels.com  * www.stomabags.com or 786-016-5397 supplies for non or uninsure.  Explained to patient that this RNCM had sent him a message in [place] with the same information on 05/26/21.  Patient noted he forgot that the information was in [place]. Patient to let this RNCM know if he needed any other assistance to please call. Jodelle Gross, RN, BSN, CCM Care Management Coordinator [facility] Internal Medicine Phone: [phone]/Fax: [phone]

## 2021-07-05 NOTE — Chronic Care Management (AMB) (Signed)
°  Care Management   Social Work Visit Note  07/05/2021 Name: Jonathan [name] MRN: [id number] DOB: 24-Jan-2001  Jonathan [name] is a 20 y.o. year old male who sees Belva Agee, MD for primary care. The care management team was consulted for assistance with care management and care coordination needs related to [facility] Resources    Patient was given the following information about care management and care coordination services today, agreed to services, and gave verbal consent: 1.care management/care coordination services include personalized support from designated clinical staff supervised by their physician, including individualized plan of care and coordination with other care providers 2. 24/7 contact phone numbers for assistance for urgent and routine care needs. 3. The patient may stop care management/care coordination services at any time by phone call to the office staff.  Engaged with patient by telephone for follow up visit in response to provider referral for social work chronic care management and care coordination services.  Assessment: Review of patient history, allergies, and health status during evaluation of patient need for care management/care coordination services.    Interventions:  Patient interviewed and appropriate assessments performed Collaborated with clinical team regarding patient needs  SW spoke with patient regarding disability denial. Patient acquired an attorney to appeal decision. Patient understands once deemed disabled he will be approved for medicaid.  Patient requested assistance from [facility] regarding supplies needed.  Patient requested an appointment scheduled for in clinic appointment for after January and preferably in the morning.   SDOH (Social Determinants of Health) assessments performed: Yes     Plan:  patient will work with BSW to address needs related to Level of care concerns SW will  request front desk schedule medical follow up  appointment.  SW will request RNCM outreach patient within the next 30 days.  [name], BSW  Social Worker [facility] Care Management  [phone]

## 2021-07-05 NOTE — Patient Instructions (Signed)
Visit Information  Instructions: patient will work with SW to address concerns related to Level of care  Patient was given the following information about care management and care coordination services today, agreed to services, and gave verbal consent: 1.care management/care coordination services include personalized support from designated clinical staff supervised by their physician, including individualized plan of care and coordination with other care providers 2. 24/7 contact phone numbers for assistance for urgent and routine care needs. 3. The patient may stop care management/care coordination services at any time by phone call to the office staff.  Patient verbalizes understanding of instructions provided today and agrees to view in MyChart.   The care management team will reach out to the patient again over the next 30 days.  Anyah Swallow, BSW  Social Worker [facility] Care Management  [phone]      

## 2021-07-07 ENCOUNTER — Telehealth: Payer: Self-pay | Admitting: Internal Medicine

## 2021-07-07 DIAGNOSIS — S3661XA Primary blast injury of rectum, initial encounter: Secondary | ICD-10-CM

## 2021-07-07 MED ORDER — OXYCODONE HCL 10 MG PO TABS: 10.0000 mg | ORAL_TABLET | Freq: Three times a day (TID) | ORAL | 0 refills | Status: AC | PRN

## 2021-07-07 MED ORDER — OXYCODONE HCL 10 MG PO TABS: 10.0000 mg | ORAL_TABLET | Freq: Three times a day (TID) | ORAL | 0 refills | Status: DC | PRN
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
## 2021-07-07 NOTE — Addendum Note (Signed)
Addended by: Eliezer Bottom on: 07/07/2021 11:16 AM   Modules accepted: Orders

## 2021-07-14 ENCOUNTER — Ambulatory Visit (INDEPENDENT_AMBULATORY_CARE_PROVIDER_SITE_OTHER): Payer: Self-pay | Admitting: Student

## 2021-07-14 ENCOUNTER — Encounter: Payer: Self-pay | Admitting: Student

## 2021-07-14 ENCOUNTER — Other Ambulatory Visit: Payer: Self-pay

## 2021-07-14 VITALS — BP 102/55 | HR 79 | Temp 98.3°F | Ht 72.0 in | Wt 150.0 lb

## 2021-07-14 DIAGNOSIS — R300 Dysuria: Secondary | ICD-10-CM

## 2021-07-14 DIAGNOSIS — S3661XA Primary blast injury of rectum, initial encounter: Secondary | ICD-10-CM

## 2021-07-14 DIAGNOSIS — R399 Unspecified symptoms and signs involving the genitourinary system: Secondary | ICD-10-CM

## 2021-07-14 DIAGNOSIS — S3720XA Unspecified injury of bladder, initial encounter: Secondary | ICD-10-CM

## 2021-07-14 DIAGNOSIS — S32009S Unspecified fracture of unspecified lumbar vertebra, sequela: Secondary | ICD-10-CM

## 2021-07-14 NOTE — Patient Instructions (Addendum)
We will check a urinalysis and give you a call about the results.   We will call [facility] orthopedics for your left leg.
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
## 2021-07-16 NOTE — Assessment & Plan Note (Signed)
Patient reports that for the last couple of weeks he has felt that he is incompletely emptying his bladder. He has also noticed that his urine color has changed in that it is a darker yellow. He denies dysuria or abdominal pain. Prior to this episode he was urinating without difficulty.   Patient's symptoms could be due to his bladder injury causing spasming of the bladder and resulting in incomplete emptying. Patient is concerned about an infection but has no dysuria, abdominal pain, or frequency making this less likely.   Patient plans to come to clinic for bladder scan.

## 2021-07-16 NOTE — Assessment & Plan Note (Addendum)
Patient was denied by [facility] orthopedics to be seen for his LLE contracture. His PCP is in the process of sending an appeal for this. Patient reports he bought a brace from [facility] and he his able to sit up in a chair with no pain, and he can have the leg be touched without any pain.  -Sent referral to [facility] orthopedics  -PM&R appointment is scheduled for March  -Continue PRN pain medications

## 2021-07-16 NOTE — Assessment & Plan Note (Signed)
Patient has not been able to follow up with general surgery due to his insurance. His PCP is working to get patient scheduled with a larger tertiary center to discuss reversal. Patient is not currently having any issues with the ostomy appliance or bag.

## 2021-07-17 NOTE — Progress Notes (Signed)
Internal Medicine Clinic Attending  Case discussed with Dr. Carter  At the time of the visit.  We reviewed the resident's history and exam and pertinent patient test results.  I agree with the assessment, diagnosis, and plan of care documented in the resident's note.  

## 2021-07-18 ENCOUNTER — Ambulatory Visit: Payer: Self-pay | Admitting: Neurology

## 2021-07-18 ENCOUNTER — Telehealth: Payer: Self-pay | Admitting: Student

## 2021-07-18 NOTE — Telephone Encounter (Signed)
-----   Message from Marolyn Haller, MD sent at 07/14/2021  2:50 PM EST ----- Would it be possible to just have him come in for a bladder scan, before and after voiding on Monday? He would not need a formal appointment. I could do it in between other appointments.    Thank you!

## 2021-07-18 NOTE — Telephone Encounter (Signed)
Please refer to message below.  Just spoke with patient and he agreed to an appointment for Friday 07/21/2021 at 10:15 am.

## 2021-07-21 ENCOUNTER — Encounter: Payer: Medicaid Other | Admitting: Student

## 2021-07-28 ENCOUNTER — Encounter: Payer: Self-pay | Admitting: Student

## 2021-07-28 ENCOUNTER — Ambulatory Visit (INDEPENDENT_AMBULATORY_CARE_PROVIDER_SITE_OTHER): Payer: Self-pay | Admitting: Student

## 2021-07-28 ENCOUNTER — Other Ambulatory Visit: Payer: Self-pay

## 2021-07-28 DIAGNOSIS — S32009S Unspecified fracture of unspecified lumbar vertebra, sequela: Secondary | ICD-10-CM

## 2021-07-28 DIAGNOSIS — S3661XA Primary blast injury of rectum, initial encounter: Secondary | ICD-10-CM

## 2021-07-28 NOTE — Progress Notes (Signed)
Patient presented today for follow up of incomplete bladder emptying. States these symptoms have resolved with changing positioning. He is feeling well. Unfortunately patietn had to leave early to pick up his mother.  Will be billed as no charge.

## 2021-07-28 NOTE — Assessment & Plan Note (Signed)
Updated patient that unfortunately [facility] referral and appeal have been denied.  Discussed with referral coordinator planning she has completed paperwork to send referral to [facility].  We will have him follow-up with PMR in March

## 2021-07-28 NOTE — Assessment & Plan Note (Signed)
Presents for follow-up of incomplete bladder emptying.  States this is resolved.  Was previously urinating on the side and felt like he was not completely emptying his bladder.  Since then he has reposition to urinating on his back and has resolved his symptoms.  Denies any urinary frequency, pain, dribbling, other incontinence.

## 2021-07-30 ENCOUNTER — Other Ambulatory Visit: Payer: Self-pay | Admitting: Student

## 2021-08-03 NOTE — Telephone Encounter (Signed)
Pt called / informed of Gabapentin refill.

## 2021-08-03 NOTE — Telephone Encounter (Signed)
Call from pt asking about Gabapentin refill. Last OV 07/28/21. Refill was requested 07/30/21. Thanks

## 2021-08-07 ENCOUNTER — Encounter: Payer: Self-pay | Admitting: Student

## 2021-08-07 ENCOUNTER — Telehealth: Payer: Self-pay

## 2021-08-07 MED ORDER — OXYCODONE HCL 10 MG PO TABS: 10.0000 mg | ORAL_TABLET | Freq: Three times a day (TID) | ORAL | 0 refills | Status: DC | PRN

## 2021-08-07 NOTE — Telephone Encounter (Signed)
Patient notified that Rx has been sent. He is very Adult nurse. He will call 3-5 days ahead going forward.

## 2021-08-07 NOTE — Telephone Encounter (Signed)
Oxycodone HCl 10 MG TABS , refill request @ [facility] [phone] - [place], [address] AT [address].

## 2021-08-07 NOTE — Telephone Encounter (Signed)
Pt states he need this pain medication by today. Per pt the pharmacy will closed @ 6pm today.

## 2021-08-11 ENCOUNTER — Telehealth: Payer: Self-pay | Admitting: Neurology

## 2021-08-11 ENCOUNTER — Ambulatory Visit (INDEPENDENT_AMBULATORY_CARE_PROVIDER_SITE_OTHER): Payer: Self-pay | Admitting: Neurology

## 2021-08-11 ENCOUNTER — Encounter: Payer: Self-pay | Admitting: Neurology

## 2021-08-11 VITALS — BP 106/49 | HR 84 | Ht 72.0 in

## 2021-08-11 DIAGNOSIS — G40909 Epilepsy, unspecified, not intractable, without status epilepticus: Secondary | ICD-10-CM

## 2021-08-11 NOTE — Patient Instructions (Signed)
Continue with Keppra 500 mg twice daily. Will obtain a Keppra level today MRI brain with and without contrast, epilepsy protocol Routine EEG Follow-up with him in 63-months or sooner if worse
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
## 2021-08-11 NOTE — Telephone Encounter (Signed)
pt states he has [facility] order sent to [facility], they will reach out to the patient to schedule.

## 2021-08-15 LAB — LEVETIRACETAM LEVEL: Levetiracetam Lvl: 14.7 ug/mL (ref 10.0–40.0)

## 2021-08-23 ENCOUNTER — Other Ambulatory Visit: Payer: Self-pay | Admitting: *Deleted

## 2021-08-23 ENCOUNTER — Telehealth: Payer: Self-pay | Admitting: Neurology

## 2021-08-23 NOTE — Telephone Encounter (Signed)
Pt missed appt due to overslept

## 2021-08-31 ENCOUNTER — Other Ambulatory Visit: Payer: Self-pay | Admitting: *Deleted

## 2021-09-04 ENCOUNTER — Other Ambulatory Visit: Payer: Self-pay | Admitting: Student

## 2021-09-04 MED ORDER — OXYCODONE HCL 10 MG PO TABS: 10.0000 mg | ORAL_TABLET | Freq: Three times a day (TID) | ORAL | 0 refills | Status: DC | PRN

## 2021-09-04 NOTE — Telephone Encounter (Signed)
Last rx written 07/3021. Last OV 07/28/21. Next OV- has not been scheduled. UDS 01/12/21.

## 2021-09-04 NOTE — Telephone Encounter (Signed)
Refill Request   tablet oxyCODONE 10 MG TABS    [facility] [phone] - [place], [place] - [phone] [facility] [address] (Ph: [phone])

## 2021-09-25 ENCOUNTER — Ambulatory Visit (INDEPENDENT_AMBULATORY_CARE_PROVIDER_SITE_OTHER): Payer: 59 | Admitting: Internal Medicine

## 2021-09-25 ENCOUNTER — Encounter: Payer: Self-pay | Admitting: Internal Medicine

## 2021-09-25 ENCOUNTER — Other Ambulatory Visit: Payer: Self-pay

## 2021-09-25 VITALS — BP 131/62 | HR 105 | Temp 98.6°F | Ht 72.0 in | Wt 141.0 lb

## 2021-09-25 DIAGNOSIS — M24562 Contracture, left knee: Secondary | ICD-10-CM | POA: Diagnosis not present

## 2021-09-25 DIAGNOSIS — Z0000 Encounter for general adult medical examination without abnormal findings: Secondary | ICD-10-CM

## 2021-09-25 DIAGNOSIS — S3720XA Unspecified injury of bladder, initial encounter: Secondary | ICD-10-CM | POA: Diagnosis not present

## 2021-09-25 DIAGNOSIS — G40909 Epilepsy, unspecified, not intractable, without status epilepticus: Secondary | ICD-10-CM
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
## 2021-09-25 NOTE — Patient Instructions (Signed)
Dear Jonathan [name], ? ?Thank you for trusting [place] with your care today.  ? ?We discussed your knee, the seizures, and health screening tests. ? ?For your knee, we recommend you work with the physical therapists. Please continue taking the gabapentin, oxycodone, tylenol. ?We have ordered an x-ray of your knee. ? ?For your seizures, please continue with your Keppra.  ?An MRI of your brain has been ordered, please follow up for with this. ? ?Lastly, we have ordered the blood screening test for Hep C.  ? ?We will plan to see you back in the clinic in 3 months to follow up.  ?

## 2021-09-26 ENCOUNTER — Encounter: Payer: Self-pay | Admitting: Internal Medicine

## 2021-09-26 DIAGNOSIS — M24562 Contracture, left knee: Secondary | ICD-10-CM | POA: Insufficient documentation

## 2021-09-26 LAB — HCV INTERPRETATION

## 2021-09-26 LAB — HCV AB W REFLEX TO QUANT PCR: HCV Ab: NONREACTIVE

## 2021-09-26 NOTE — Assessment & Plan Note (Signed)
Patient due to HPV and Hep C screening. Declined flu and covid vaccination. ? ?Checked Hep C which returned negative.  ?

## 2021-09-26 NOTE — Assessment & Plan Note (Addendum)
Patient presenting with mother for follow up evaluation of left leg contracture. He recently was evaluated by [facility] ortho who recommended possible botox injections into hamstrings to allow for passive stretching. Was not felt to be a surgical candidate at this time. They recommended that he work with physical therapy (he is scheduled for first visit with [facility] this Friday) to improve ROM and strength in his leg prior to considering surgical options. Patient expresses frustration over the limited use of his leg. Reports that it is improving slowly. Leg initially was contracted full flexion. He is now able to extend leg at the knee to approximately 90 degrees. Primary complaint by patient is inability to fully extend leg limiting walking. He states he is able to flex/extend at ankle, but does have difficulty with movement of toes. He reports sensation is preserved and symmetric bilaterally ? ? He does still have strength 4/5, both in hip flexors/extensors as well as knee flexion and extension of left leg. Patient reports full ROM of ankle, though I did not personally assess this during visit. He does have limited movement in toes. Sensation intact. ? ?Recommended patient follow up with physical therapy to improve ROM and strength in leg. Ordered x-ray of knee per [facility] ortho note. We will continue with gabapentin, tylenol, and oxycodone as currently scheduled.  Will plan to follow up in 3 months to re-evaluate. ?

## 2021-09-26 NOTE — Assessment & Plan Note (Signed)
Ptient follows with [facility] for seizure disorder. He takes Keppra 500mg  BID per neuro and reports last seizure July 2022. It had been recommended that patient undergo MRI for further evaluation of seizures, though patient has not yet completed this. ?Seizures appear to be under control at this time.  ?Advised patient to have MRI done. Will not make any changes to medication at this time. Continue to follow with neurology. ?

## 2021-09-26 NOTE — Assessment & Plan Note (Addendum)
Previously evaluated for difficulty with urination. He reports that he has been able to urinate normally again.  ?No further workup at this time. Will continue to monitor. ?

## 2021-09-29 ENCOUNTER — Encounter: Payer: Self-pay | Admitting: Physical Medicine and Rehabilitation

## 2021-09-29 ENCOUNTER — Encounter: Payer: 59 | Attending: Physical Medicine and Rehabilitation | Admitting: Physical Medicine and Rehabilitation

## 2021-09-29 ENCOUNTER — Other Ambulatory Visit: Payer: Self-pay

## 2021-09-29 VITALS — BP 125/75 | HR 99 | Ht 72.0 in | Wt 131.0 lb

## 2021-09-29 DIAGNOSIS — M24562 Contracture, left knee: Secondary | ICD-10-CM

## 2021-09-29 DIAGNOSIS — M21372 Foot drop, left foot: Secondary | ICD-10-CM

## 2021-09-29 DIAGNOSIS — M792 Neuralgia and neuritis, unspecified: Secondary | ICD-10-CM | POA: Diagnosis present

## 2021-09-29 MED ORDER — GABAPENTIN 400 MG PO CAPS: 800.0000 mg | ORAL_CAPSULE | Freq: Three times a day (TID) | ORAL | 5 refills | Status: DC
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
## 2021-09-29 NOTE — Progress Notes (Signed)
Internal Medicine Clinic Attending ? ?Case discussed with Dr. Elliot Gurney  at the time of the visit.  We reviewed the resident?s history and exam and pertinent patient test results.  I agree with the assessment, diagnosis, and plan of care documented in the resident?s note.  ? ?I believe in screening, patient is due for HIV screening, as opposed to HPV screening.  Otherwise agree as noted.  ?
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
## 2021-10-02 ENCOUNTER — Other Ambulatory Visit: Payer: Self-pay | Admitting: Student

## 2021-10-02 MED ORDER — OXYCODONE HCL 10 MG PO TABS: 10.0000 mg | ORAL_TABLET | Freq: Three times a day (TID) | ORAL | 0 refills | Status: DC | PRN

## 2021-10-02 NOTE — Telephone Encounter (Signed)
Refill Request-  Pt request the following medication to be filled: ? ? ?gabapentin (NEURONTIN) 400 MG capsule ? ? ?Oxycodone HCl 10 MG TABS ? ? ?[facility] [phone] - [place], [place] AT [place] (Ph: [phone]) ?

## 2021-10-02 NOTE — Telephone Encounter (Signed)
Gabapentin #180 with 5 refills sent 3/24. ?

## 2021-10-23 ENCOUNTER — Other Ambulatory Visit: Payer: Self-pay

## 2021-10-23 MED ORDER — LEVETIRACETAM 500 MG PO TABS: ORAL_TABLET | ORAL | 1 refills | Status: DC

## 2021-10-23 NOTE — Telephone Encounter (Signed)
levETIRAcetam (KEPPRA) 500 MG tablet, REFILL REQUEST @ [facility] [phone] - [place], [place] - [address] AT [address] ?

## 2021-11-01 ENCOUNTER — Other Ambulatory Visit: Payer: Self-pay

## 2021-11-01 MED ORDER — OXYCODONE HCL 10 MG PO TABS: 10.0000 mg | ORAL_TABLET | Freq: Three times a day (TID) | ORAL | 0 refills | Status: DC | PRN

## 2021-11-01 NOTE — Telephone Encounter (Signed)
Oxycodone HCl 10 MG TABS , refill request @ [id number] - [place], [place] - [address] AT [address] °

## 2021-11-14 ENCOUNTER — Encounter: Payer: 59 | Attending: Physical Medicine and Rehabilitation | Admitting: Physical Medicine & Rehabilitation

## 2021-11-14 ENCOUNTER — Encounter: Payer: Self-pay | Admitting: Physical Medicine & Rehabilitation

## 2021-11-14 VITALS — BP 148/81 | HR 105 | Ht 72.0 in | Wt 144.0 lb

## 2021-11-14 DIAGNOSIS — M62838 Other muscle spasm: Secondary | ICD-10-CM | POA: Diagnosis not present

## 2021-11-14 DIAGNOSIS — G8310 Monoplegia of lower limb affecting unspecified side: Secondary | ICD-10-CM | POA: Insufficient documentation

## 2021-11-14 NOTE — Patient Instructions (Signed)
You received a Botox injection today. You may experience soreness at the needle injection sites. Please call us if any of the injection sites turns red after a couple days or if there is any drainage. You may experience muscle weakness as a result of Botox. This would improve with time but can take several weeks to improve. ?The Botox should start working in about one week. ?Try to stretch and extend the Left knee starting in 1 wk ?The Botox usually last 3 months. ?The injection can be repeated every 3 months as needed.  ?

## 2021-11-14 NOTE — Progress Notes (Signed)
Botox Injection for spasticity using needle EMG guidance ? ?Dilution: 50 Units/ml ?Indication: Severe spasticity which interferes with ADL,mobility and/or  hygiene and is unresponsive to medication management and other conservative care ?Informed consent was obtained after describing risks and benefits of the procedure with the patient. This includes bleeding, bruising, infection, excessive weakness, or medication side effects. A REMS form is on file and signed. ?Needle: 25g 2" needle electrode ?Number of units per muscle ? ?Gastrosoleus 50 U medial 50U Lateral gastroc 2+ MUAP ?Hamstrings 200 U medial , 100U lateral  1+ MUAP ?All injections were done after obtaining appropriate EMG activity and after negative drawback for blood. The patient tolerated the procedure well. Post procedure instructions were given. A followup appointment was made.   ?Pt will follow up with Dr Dagoberto Ligas in ~7wks to eval effectiveness in reducing hamstring deformity  ? ?Left hamstring is at 90 deg flexion, left ankle gets to neutral dorsiflexion ?Left Quad atrophy ?No clonus at Left ankle , Left knee DTR 2+ ?Pt reports intact sensation LLE to LT with exception of Left great toe ? ?EMG would likely not change treatment at this time  ?

## 2021-11-28 ENCOUNTER — Other Ambulatory Visit: Payer: Self-pay

## 2021-11-28 NOTE — Telephone Encounter (Signed)
Last rx written 11/01/21. Last OV 09/25/21. Next OV  12/07/21 with Dr Allena Katz.

## 2021-11-28 NOTE — Telephone Encounter (Signed)
Oxycodone HCl 10 MG TABS   [facility] [phone] - [place], [place] [phone] [facility] [address] AT [facility] [address]  [address] [place] [address]  Phone:  [phone]  Fax:  [phone]

## 2021-11-29 MED ORDER — OXYCODONE HCL 10 MG PO TABS: 10.0000 mg | ORAL_TABLET | Freq: Three times a day (TID) | ORAL | 0 refills | Status: DC | PRN

## 2021-12-07 ENCOUNTER — Ambulatory Visit (INDEPENDENT_AMBULATORY_CARE_PROVIDER_SITE_OTHER): Payer: 59 | Admitting: Internal Medicine

## 2021-12-07 ENCOUNTER — Encounter: Payer: Self-pay | Admitting: Internal Medicine

## 2021-12-07 ENCOUNTER — Other Ambulatory Visit: Payer: Self-pay

## 2021-12-07 DIAGNOSIS — G40909 Epilepsy, unspecified, not intractable, without status epilepticus: Secondary | ICD-10-CM

## 2021-12-07 DIAGNOSIS — Z0000 Encounter for general adult medical examination without abnormal findings: Secondary | ICD-10-CM

## 2021-12-07 DIAGNOSIS — M24562 Contracture, left knee: Secondary | ICD-10-CM
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
## 2021-12-07 NOTE — Assessment & Plan Note (Addendum)
Up to date at this point.  Believes he has has HPV series; instructed to bring card so that we can input into our system.

## 2021-12-07 NOTE — Patient Instructions (Signed)
It was so nice to meet you today Jonathan [name]!   I am so glad to see you doing well.   Please continue to take your medications and let us know if you need anything.   Please bring your HPV card with you next time so we can input it into our system.

## 2021-12-07 NOTE — Assessment & Plan Note (Addendum)
S/p botox injections into hamstrings by PM&R 11/14/21 for passive stretching. Has been doing various PT therapies as well per PM&R. Reports doing and feeling well since following with PM&R. He reports noticing much improvement in mobility since getting Botox injection. He has no complaints at this time, and is pleased with the improved QOL. He notes improvement in his mental health as well; discussed s/s of depression and that there are medications to help with depression if he ever notices these symptoms. Overall, pt has come a long way, and is making improvement.   - Continue PM&R/PT follow up  - Continue Gabapentin, Tylenol, and Oxychodone as scheduled  - Follow up in 3 months

## 2021-12-07 NOTE — Assessment & Plan Note (Signed)
Well controlled with Keppra 500 BID. Good medication compliance. No seizure activity.  Continue Keppra 500 mg BID -- has medications, and no refills needed Continue OP [facility] follow up

## 2021-12-08 NOTE — Progress Notes (Signed)
Internal Medicine Clinic Attending  Case discussed with Dr. Patel  At the time of the visit.  We reviewed the resident's history and exam and pertinent patient test results.  I agree with the assessment, diagnosis, and plan of care documented in the resident's note.  

## 2021-12-08 NOTE — Addendum Note (Signed)
Addended by: Earl Lagos on: 12/08/2021 02:09 PM   Modules accepted: Level of Service

## 2021-12-20 ENCOUNTER — Ambulatory Visit: Payer: Self-pay | Admitting: Surgery

## 2021-12-20 DIAGNOSIS — R739 Hyperglycemia, unspecified: Secondary | ICD-10-CM

## 2021-12-29 ENCOUNTER — Other Ambulatory Visit: Payer: Self-pay | Admitting: Internal Medicine

## 2021-12-29 MED ORDER — OXYCODONE HCL 10 MG PO TABS: 10.0000 mg | ORAL_TABLET | Freq: Three times a day (TID) | ORAL | 0 refills | Status: DC | PRN

## 2022-01-05 ENCOUNTER — Other Ambulatory Visit: Payer: Self-pay | Admitting: Urology

## 2022-01-10 ENCOUNTER — Encounter: Payer: 59 | Admitting: Physical Medicine and Rehabilitation

## 2022-01-12 ENCOUNTER — Encounter (HOSPITAL_COMMUNITY): Payer: Self-pay | Admitting: Nurse Practitioner

## 2022-01-19 ENCOUNTER — Encounter (HOSPITAL_COMMUNITY): Payer: Self-pay | Admitting: Nurse Practitioner

## 2022-01-29 ENCOUNTER — Other Ambulatory Visit: Payer: Self-pay | Admitting: Internal Medicine

## 2022-01-29 NOTE — Telephone Encounter (Signed)
gabapentin (NEURONTIN) 400 MG capsule,  Oxycodone HCl 10 MG TABS, REFILL REQUEST @ [facility] [phone] - [place], [place] - [address] AT [address].

## 2022-01-29 NOTE — Patient Instructions (Addendum)
DUE TO COVID-19 ONLY TWO VISITORS  (aged 23 and older)  ARE ALLOWED TO COME WITH YOU AND STAY IN THE WAITING ROOM ONLY DURING PRE OP AND PROCEDURE.   **NO VISITORS ARE ALLOWED IN THE SHORT STAY AREA OR RECOVERY ROOM!!**  IF YOU WILL BE ADMITTED INTO THE HOSPITAL YOU ARE ALLOWED ONLY FOUR SUPPORT PEOPLE DURING VISITATION HOURS ONLY (7 AM -8PM)   The support person(s) must pass our screening, gel in and out, and wear a mask at all times, including in the patient's room. Patients must also wear a mask when staff or their support person are in the room. Visitors GUEST BADGE MUST BE WORN VISIBLY  One adult visitor may remain with you overnight and MUST be in the room by 8 P.M.     Your procedure is scheduled on: 02/08/22   Report to [facility] Main Entrance    Report to admitting at : 9:45 AM   Call this number if you have problems the morning of surgery [phone]   Do not eat food :After Midnight.   Clear liquids starting the day before surgery until : 9:00 AM DAY OF SURGERY. Drink plenty fluids to prevent dehydration.  Water Black Coffee (sugar ok, NO MILK/CREAM OR CREAMERS)  Tea (sugar ok, NO MILK/CREAM OR CREAMERS) regular and decaf                             Plain Jell-O (NO RED)                                           Fruit ices (not with fruit pulp, NO RED)                                     Popsicles (NO RED)                                                                  Juice: apple, WHITE grape, WHITE cranberry Sports drinks like Gatorade (NO RED)              DRINK 2 PRESURGERY ENSURE DRINKS THE NIGHT BEFORE SURGERY AT  1000 PM AND 1 PRESURGERY DRINK THE DAY OF THE PROCEDURE 3 HOURS PRIOR TO SCHEDULED SURGERY. NO SOLIDS AFTER MIDNIGHT THE DAY PRIOR TO THE SURGERY. NOTHING BY MOUTH EXCEPT CLEAR LIQUIDS UNTIL THREE HOURS PRIOR TO SCHEDULED SURGERY. PLEASE FINISH PRESURGERY ENSURE DRINK PER SURGEON ORDER 3 HOURS PRIOR TO SCHEDULED SURGERY TIME WHICH NEEDS TO BE  COMPLETED AT : 9:00 AM.      The day of surgery:  Drink ONE (1) Pre-Surgery Clear Ensure or G2 at AM the morning of surgery. Drink in one sitting. Do not sip.  This drink was given to you during your hospital  pre-op appointment visit. Nothing else to drink after completing the  Pre-Surgery Clear Ensure or G2.          If you have questions, please contact your surgeon's office.  FOLLOW BOWEL PREP AND ANY ADDITIONAL PRE OP INSTRUCTIONS  YOU RECEIVED FROM YOUR SURGEON'S OFFICE!!!     Oral Hygiene is also important to reduce your risk of infection.                                    Remember - BRUSH YOUR TEETH THE MORNING OF SURGERY WITH YOUR REGULAR TOOTHPASTE   Do NOT smoke after Midnight   Take these medicines the morning of surgery with A SIP OF WATER: kepra.Gabapentin,Tylenol as needed.  DO NOT TAKE ANY ORAL DIABETIC MEDICATIONS DAY OF YOUR SURGERY  Bring CPAP mask and tubing day of surgery.                              You may not have any metal on your body including hair pins, jewelry, and body piercing             Do not wear lotions, powders, perfumes/cologne, or deodorant              Men may shave face and neck.   Do not bring valuables to the hospital. [facility] IS NOT             RESPONSIBLE   FOR VALUABLES.   Contacts, dentures or bridgework may not be worn into surgery.   Bring small overnight bag day of surgery.   DO NOT BRING YOUR HOME MEDICATIONS TO THE HOSPITAL. PHARMACY WILL DISPENSE MEDICATIONS LISTED ON YOUR MEDICATION LIST TO YOU DURING YOUR ADMISSION IN THE HOSPITAL!    Patients discharged on the day of surgery will not be allowed to drive home.  Someone NEEDS to stay with you for the first 24 hours after anesthesia.   Special Instructions: Bring a copy of your healthcare power of attorney and living will documents         the day of surgery if you haven't scanned them before.              Please read over the following fact sheets you were given:  IF YOU HAVE QUESTIONS ABOUT YOUR PRE-OP INSTRUCTIONS PLEASE CALL [phone]     [facility] - Preparing for Surgery Before surgery, you can play an important role.  Because skin is not sterile, your skin needs to be as free of germs as possible.  You can reduce the number of germs on your skin by washing with CHG (chlorahexidine gluconate) soap before surgery.  CHG is an antiseptic cleaner which kills germs and bonds with the skin to continue killing germs even after washing. Please DO NOT use if you have an allergy to CHG or antibacterial soaps.  If your skin becomes reddened/irritated stop using the CHG and inform your nurse when you arrive at Short Stay. Do not shave (including legs and underarms) for at least 48 hours prior to the first CHG shower.  You may shave your face/neck. Please follow these instructions carefully:  1.  Shower with CHG Soap the night before surgery and the  morning of Surgery.  2.  If you choose to wash your hair, wash your hair first as usual with your  normal  shampoo.  3.  After you shampoo, rinse your hair and body thoroughly to remove the  shampoo.                           4.  Use CHG as you would any other liquid soap.  You can apply chg directly  to the skin and wash                       Gently with a scrungie or clean washcloth.  5.  Apply the CHG Soap to your body ONLY FROM THE NECK DOWN.   Do not use on face/ open                           Wound or open sores. Avoid contact with eyes, ears mouth and genitals (private parts).                       Wash face,  Genitals (private parts) with your normal soap.             6.  Wash thoroughly, paying special attention to the area where your surgery  will be performed.  7.  Thoroughly rinse your body with warm water from the neck down.  8.  DO NOT shower/wash with your normal soap after using and rinsing off  the CHG Soap.                9.  Pat yourself dry with a clean towel.            10.  Wear clean  pajamas.            11.  Place clean sheets on your bed the night of your first shower and do not  sleep with pets. Day of Surgery : Do not apply any lotions/deodorants the morning of surgery.  Please wear clean clothes to the hospital/surgery center.  FAILURE TO FOLLOW THESE INSTRUCTIONS MAY RESULT IN THE CANCELLATION OF YOUR SURGERY PATIENT SIGNATURE_________________________________  NURSE SIGNATURE__________________________________  ________________________________________________________________________   Jonathan [name]  An incentive spirometer is a tool that can help keep your lungs clear and active. This tool measures how well you are filling your lungs with each breath. Taking long deep breaths may help reverse or decrease the chance of developing breathing (pulmonary) problems (especially infection) following: A long period of time when you are unable to move or be active. BEFORE THE PROCEDURE  If the spirometer includes an indicator to show your best effort, your nurse or respiratory therapist will set it to a desired goal. If possible, sit up straight or lean slightly forward. Try not to slouch. Hold the incentive spirometer in an upright position. INSTRUCTIONS FOR USE  Sit on the edge of your bed if possible, or sit up as far as you can in bed or on a chair. Hold the incentive spirometer in an upright position. Breathe out normally. Place the mouthpiece in your mouth and seal your lips tightly around it. Breathe in slowly and as deeply as possible, raising the piston or the ball toward the top of the column. Hold your breath for 3-5 seconds or for as long as possible. Allow the piston or ball to fall to the bottom of the column. Remove the mouthpiece from your mouth and breathe out normally. Rest for a few seconds and repeat Steps 1 through 7 at least 10 times every 1-2 hours when you are awake. Take your time and take a few normal breaths between deep breaths. The  spirometer may include an indicator to show your best effort. Use the indicator as a goal to work toward during each  repetition. After each set of 10 deep breaths, practice coughing to be sure your lungs are clear. If you have an incision (the cut made at the time of surgery), support your incision when coughing by placing a pillow or rolled up towels firmly against it. Once you are able to get out of bed, walk around indoors and cough well. You may stop using the incentive spirometer when instructed by your caregiver.  RISKS AND COMPLICATIONS Take your time so you do not get dizzy or light-headed. If you are in pain, you may need to take or ask for pain medication before doing incentive spirometry. It is harder to take a deep breath if you are having pain. AFTER USE Rest and breathe slowly and easily. It can be helpful to keep track of a log of your progress. Your caregiver can provide you with a simple table to help with this. If you are using the spirometer at home, follow these instructions: SEEK MEDICAL CARE IF:  You are having difficultly using the spirometer. You have trouble using the spirometer as often as instructed. Your pain medication is not giving enough relief while using the spirometer. You develop fever of 100.5 F (38.1 C) or higher. SEEK IMMEDIATE MEDICAL CARE IF:  You cough up bloody sputum that had not been present before. You develop fever of 102 F (38.9 C) or greater. You develop worsening pain at or near the incision site. MAKE SURE YOU:  Understand these instructions. Will watch your condition. Will get help right away if you are not doing well or get worse. Document Released: 11/05/2006 Document Revised: 09/17/2011 Document Reviewed: 01/06/2007 [facility] Patient Information 2014 [place], [place].   ________________________________________________________________________

## 2022-01-30 ENCOUNTER — Encounter (HOSPITAL_COMMUNITY): Payer: Self-pay | Admitting: Nurse Practitioner

## 2022-01-30 ENCOUNTER — Encounter (HOSPITAL_COMMUNITY): Payer: Self-pay

## 2022-01-30 ENCOUNTER — Encounter (HOSPITAL_COMMUNITY)
Admission: RE | Admit: 2022-01-30 | Discharge: 2022-01-30 | Disposition: A | Discharge status: Home / Self Care | Payer: 59 | Source: Ambulatory Visit | Attending: Surgery | Admitting: Surgery

## 2022-01-30 ENCOUNTER — Other Ambulatory Visit: Payer: Self-pay

## 2022-01-30 VITALS — BP 121/73 | HR 100 | Temp 98.6°F | Ht 72.0 in | Wt 147.0 lb

## 2022-01-30 DIAGNOSIS — R739 Hyperglycemia, unspecified: Secondary | ICD-10-CM | POA: Diagnosis not present

## 2022-01-30 DIAGNOSIS — Z01818 Encounter for other preprocedural examination: Secondary | ICD-10-CM | POA: Diagnosis not present

## 2022-01-30 DIAGNOSIS — I471 Supraventricular tachycardia: Secondary | ICD-10-CM | POA: Diagnosis not present

## 2022-01-30 LAB — CBC
HCT: 43.8 % (ref 39.0–52.0)
Hemoglobin: 13.7 g/dL (ref 13.0–17.0)
MCH: 30 pg (ref 26.0–34.0)
MCHC: 31.3 g/dL (ref 30.0–36.0)
MCV: 95.8 fL (ref 80.0–100.0)
Platelets: 280 10*3/uL (ref 150–400)
RBC: 4.57 MIL/uL (ref 4.22–5.81)
RDW: 12 % (ref 11.5–15.5)
WBC: 7.2 10*3/uL (ref 4.0–10.5)
nRBC: 0 % (ref 0.0–0.2)

## 2022-01-30 LAB — BASIC METABOLIC PANEL
Anion gap: 7 (ref 5–15)
BUN: 12 mg/dL (ref 6–20)
CO2: 27 mmol/L (ref 22–32)
Calcium: 9.3 mg/dL (ref 8.9–10.3)
Chloride: 105 mmol/L (ref 98–111)
Creatinine, Ser: 0.81 mg/dL (ref 0.61–1.24)
GFR, Estimated: >60 mL/min (ref >60)
Glucose, Bld: 99 mg/dL (ref 70–99)
Potassium: 3.9 mmol/L (ref 3.5–5.1)
Sodium: 139 mmol/L (ref 135–145)

## 2022-01-30 LAB — HEMOGLOBIN A1C
Hgb A1c MFr Bld: 4.2 % — ABNORMAL LOW (ref 4.8–5.6)
Mean Plasma Glucose: 73.84 mg/dL

## 2022-01-30 MED ORDER — OXYCODONE HCL 10 MG PO TABS: 10.0000 mg | ORAL_TABLET | Freq: Three times a day (TID) | ORAL | 0 refills | Status: DC | PRN

## 2022-01-30 NOTE — Progress Notes (Signed)
Lab report: WBC: 15.4

## 2022-01-30 NOTE — Telephone Encounter (Signed)
PDMP reviewed and appropriate. Will follow up with toxassure at next appointment

## 2022-01-30 NOTE — Telephone Encounter (Signed)
Last appointment 12/07/2021.  No recent ToxAssure seen

## 2022-01-31 ENCOUNTER — Other Ambulatory Visit (HOSPITAL_COMMUNITY): Payer: Self-pay

## 2022-02-07 ENCOUNTER — Encounter (HOSPITAL_COMMUNITY): Payer: Self-pay | Admitting: Surgery

## 2022-02-08 ENCOUNTER — Encounter (HOSPITAL_COMMUNITY): Payer: Self-pay | Admitting: Surgery

## 2022-02-08 ENCOUNTER — Inpatient Hospital Stay (HOSPITAL_COMMUNITY): Payer: 59 | Admitting: Anesthesiology

## 2022-02-08 ENCOUNTER — Inpatient Hospital Stay (HOSPITAL_COMMUNITY): Payer: 59

## 2022-02-08 ENCOUNTER — Inpatient Hospital Stay (HOSPITAL_COMMUNITY)
Admission: RE | Admit: 2022-02-08 | Discharge: 2022-02-10 | DRG: 331 | Disposition: A | Discharge status: Home / Self Care | Payer: 59 | Source: Ambulatory Visit | Attending: Surgery | Admitting: Surgery

## 2022-02-08 ENCOUNTER — Other Ambulatory Visit: Payer: Self-pay

## 2022-02-08 ENCOUNTER — Encounter (HOSPITAL_COMMUNITY)
Admission: RE | Disposition: A | Discharge status: Home / Self Care | Payer: Self-pay | Source: Ambulatory Visit | Attending: Surgery

## 2022-02-08 DIAGNOSIS — Z433 Encounter for attention to colostomy: Secondary | ICD-10-CM

## 2022-02-08 DIAGNOSIS — G40909 Epilepsy, unspecified, not intractable, without status epilepticus: Secondary | ICD-10-CM

## 2022-02-08 DIAGNOSIS — S31139S Puncture wound of abdominal wall without foreign body, unspecified quadrant without penetration into peritoneal cavity, sequela: Secondary | ICD-10-CM | POA: Diagnosis not present

## 2022-02-08 DIAGNOSIS — Z933 Colostomy status: Secondary | ICD-10-CM | POA: Diagnosis not present

## 2022-02-08 DIAGNOSIS — K660 Peritoneal adhesions (postprocedural) (postinfection): Secondary | ICD-10-CM | POA: Diagnosis not present

## 2022-02-08 DIAGNOSIS — M21372 Foot drop, left foot: Secondary | ICD-10-CM | POA: Diagnosis present

## 2022-02-08 DIAGNOSIS — S3669XA Other injury of rectum, initial encounter: Principal | ICD-10-CM | POA: Diagnosis present

## 2022-02-08 DIAGNOSIS — F17290 Nicotine dependence, other tobacco product, uncomplicated: Secondary | ICD-10-CM | POA: Diagnosis present

## 2022-02-08 DIAGNOSIS — R69 Illness, unspecified: Secondary | ICD-10-CM | POA: Diagnosis not present

## 2022-02-08 DIAGNOSIS — M24562 Contracture, left knee: Secondary | ICD-10-CM | POA: Diagnosis present

## 2022-02-08 DIAGNOSIS — Z408 Encounter for other prophylactic surgery: Secondary | ICD-10-CM | POA: Diagnosis not present

## 2022-02-08 DIAGNOSIS — M792 Neuralgia and neuritis, unspecified: Secondary | ICD-10-CM | POA: Diagnosis present

## 2022-02-08 HISTORY — PX: PROCTOSCOPY: SHX2266

## 2022-02-08 HISTORY — PX: LYSIS OF ADHESION: SHX5961

## 2022-02-08 HISTORY — PX: XI ROBOTIC ASSISTED COLOSTOMY TAKEDOWN: SHX6828

## 2022-02-08 LAB — TYPE AND SCREEN
ABO/RH(D): B POS
Antibody Screen: NEGATIVE

## 2022-02-08 SURGERY — CLOSURE, COLOSTOMY, ROBOT-ASSISTED
Anesthesia: General

## 2022-02-08 MED ORDER — ACETAMINOPHEN 500 MG PO TABS
1000.0000 mg | ORAL_TABLET | Freq: Four times a day (QID) | ORAL | Status: DC
  Administered 2022-02-08 – 2022-02-10 (×6): 1000 mg via ORAL
  Filled 2022-02-08 (×7): qty 2

## 2022-02-08 MED ORDER — DIPHENHYDRAMINE HCL 50 MG/ML IJ SOLN: 12.5000 mg | Freq: Four times a day (QID) | INTRAMUSCULAR | Status: DC | PRN

## 2022-02-08 MED ORDER — METHOCARBAMOL 500 MG PO TABS: 1000.0000 mg | ORAL_TABLET | Freq: Four times a day (QID) | ORAL | Status: DC | PRN

## 2022-02-08 MED ORDER — SIMETHICONE 80 MG PO CHEW: 40.0000 mg | CHEWABLE_TABLET | Freq: Four times a day (QID) | ORAL | Status: DC | PRN

## 2022-02-08 MED ORDER — LEVETIRACETAM 500 MG PO TABS
500.0000 mg | ORAL_TABLET | Freq: Two times a day (BID) | ORAL | Status: DC
  Administered 2022-02-08 – 2022-02-10 (×4): 500 mg via ORAL
  Filled 2022-02-08 (×4): qty 1

## 2022-02-08 MED ORDER — BUPIVACAINE LIPOSOME 1.3 % IJ SUSP
INTRAMUSCULAR | Status: AC
  Filled 2022-02-08: qty 20

## 2022-02-08 MED ORDER — PHENYLEPHRINE 80 MCG/ML (10ML) SYRINGE FOR IV PUSH (FOR BLOOD PRESSURE SUPPORT)
PREFILLED_SYRINGE | INTRAVENOUS | Status: AC
  Filled 2022-02-08: qty 10

## 2022-02-08 MED ORDER — CELECOXIB 200 MG PO CAPS
200.0000 mg | ORAL_CAPSULE | ORAL | Status: AC
  Administered 2022-02-08: 200 mg via ORAL
  Filled 2022-02-08: qty 1

## 2022-02-08 MED ORDER — HYDROMORPHONE HCL 1 MG/ML IJ SOLN
INTRAMUSCULAR | Status: AC
  Filled 2022-02-08: qty 1

## 2022-02-08 MED ORDER — ALUM & MAG HYDROXIDE-SIMETH 200-200-20 MG/5ML PO SUSP
30.0000 mL | Freq: Four times a day (QID) | ORAL | Status: DC | PRN
Start: 2022-02-08 — End: 2022-02-10

## 2022-02-08 MED ORDER — ONDANSETRON HCL 4 MG/2ML IJ SOLN
INTRAMUSCULAR | Status: DC | PRN
  Administered 2022-02-08: 4 mg via INTRAVENOUS

## 2022-02-08 MED ORDER — STERILE WATER FOR INJECTION IJ SOLN
INTRAMUSCULAR | Status: DC | PRN
  Administered 2022-02-08: 18 mL

## 2022-02-08 MED ORDER — ENSURE SURGERY PO LIQD
237.0000 mL | Freq: Two times a day (BID) | ORAL | Status: DC
Start: 2022-02-09 — End: 2022-02-10
  Administered 2022-02-09: 237 mL via ORAL

## 2022-02-08 MED ORDER — LIDOCAINE 2% (20 MG/ML) 5 ML SYRINGE
INTRAMUSCULAR | Status: DC | PRN
  Administered 2022-02-08: 80 mg via INTRAVENOUS

## 2022-02-08 MED ORDER — ENALAPRILAT 1.25 MG/ML IV SOLN: 0.6250 mg | Freq: Four times a day (QID) | INTRAVENOUS | Status: DC | PRN

## 2022-02-08 MED ORDER — FENTANYL CITRATE (PF) 100 MCG/2ML IJ SOLN
INTRAMUSCULAR | Status: DC | PRN
  Administered 2022-02-08: 150 ug via INTRAVENOUS
  Administered 2022-02-08 (×2): 50 ug via INTRAVENOUS

## 2022-02-08 MED ORDER — SODIUM CHLORIDE 0.9 % IV SOLN
2.0000 g | Freq: Two times a day (BID) | INTRAVENOUS | Status: AC
  Administered 2022-02-08: 2 g via INTRAVENOUS
  Filled 2022-02-08: qty 2

## 2022-02-08 MED ORDER — BUPIVACAINE-EPINEPHRINE (PF) 0.25% -1:200000 IJ SOLN
INTRAMUSCULAR | Status: AC
  Filled 2022-02-08: qty 60

## 2022-02-08 MED ORDER — METRONIDAZOLE 500 MG PO TABS: 1000.0000 mg | ORAL_TABLET | ORAL | Status: DC

## 2022-02-08 MED ORDER — CALCIUM POLYCARBOPHIL 625 MG PO TABS
625.0000 mg | ORAL_TABLET | Freq: Two times a day (BID) | ORAL | Status: DC
  Administered 2022-02-09 – 2022-02-10 (×3): 625 mg via ORAL
  Filled 2022-02-08 (×4): qty 1

## 2022-02-08 MED ORDER — BUPIVACAINE LIPOSOME 1.3 % IJ SUSP: 20.0000 mL | Freq: Once | INTRAMUSCULAR | Status: DC

## 2022-02-08 MED ORDER — CHLORHEXIDINE GLUCONATE CLOTH 2 % EX PADS: 6.0000 | MEDICATED_PAD | Freq: Once | CUTANEOUS | Status: DC

## 2022-02-08 MED ORDER — ACETAMINOPHEN 10 MG/ML IV SOLN
INTRAVENOUS | Status: AC
  Filled 2022-02-08: qty 100

## 2022-02-08 MED ORDER — METOPROLOL TARTRATE 5 MG/5ML IV SOLN: 5.0000 mg | Freq: Four times a day (QID) | INTRAVENOUS | Status: DC | PRN

## 2022-02-08 MED ORDER — GABAPENTIN 300 MG PO CAPS
300.0000 mg | ORAL_CAPSULE | ORAL | Status: DC
  Filled 2022-02-08: qty 1

## 2022-02-08 MED ORDER — FENTANYL CITRATE PF 50 MCG/ML IJ SOSY: 25.0000 ug | PREFILLED_SYRINGE | INTRAMUSCULAR | Status: DC | PRN

## 2022-02-08 MED ORDER — BISACODYL 5 MG PO TBEC: 20.0000 mg | DELAYED_RELEASE_TABLET | Freq: Once | ORAL | Status: DC

## 2022-02-08 MED ORDER — SPY AGENT GREEN - (INDOCYANINE FOR INJECTION)
INTRAMUSCULAR | Status: DC | PRN
  Administered 2022-02-08: 5 mL via INTRAVENOUS

## 2022-02-08 MED ORDER — SODIUM CHLORIDE 0.9 % IV SOLN: Freq: Three times a day (TID) | INTRAVENOUS | Status: DC | PRN

## 2022-02-08 MED ORDER — METHOCARBAMOL 1000 MG/10ML IJ SOLN
1000.0000 mg | Freq: Four times a day (QID) | INTRAVENOUS | Status: DC | PRN
  Administered 2022-02-08: 1000 mg via INTRAVENOUS
  Filled 2022-02-08: qty 1000

## 2022-02-08 MED ORDER — METHOCARBAMOL 500 MG IVPB - SIMPLE MED
INTRAVENOUS | Status: AC
  Filled 2022-02-08: qty 110

## 2022-02-08 MED ORDER — ACETAMINOPHEN 10 MG/ML IV SOLN
1000.0000 mg | Freq: Once | INTRAVENOUS | Status: AC
  Administered 2022-02-08: 1000 mg via INTRAVENOUS

## 2022-02-08 MED ORDER — IOHEXOL 300 MG/ML  SOLN
INTRAMUSCULAR | Status: DC | PRN
  Administered 2022-02-08: 3 mL

## 2022-02-08 MED ORDER — CHLORHEXIDINE GLUCONATE 0.12 % MT SOLN
15.0000 mL | Freq: Once | OROMUCOSAL | Status: AC
  Administered 2022-02-08: 15 mL via OROMUCOSAL

## 2022-02-08 MED ORDER — DEXAMETHASONE SODIUM PHOSPHATE 10 MG/ML IJ SOLN
INTRAMUSCULAR | Status: DC | PRN
  Administered 2022-02-08: 10 mg via INTRAVENOUS

## 2022-02-08 MED ORDER — LACTATED RINGERS IV BOLUS: 1000.0000 mL | Freq: Once | INTRAVENOUS | Status: DC

## 2022-02-08 MED ORDER — PHENYLEPHRINE 80 MCG/ML (10ML) SYRINGE FOR IV PUSH (FOR BLOOD PRESSURE SUPPORT)
PREFILLED_SYRINGE | INTRAVENOUS | Status: DC | PRN
  Administered 2022-02-08 (×2): 80 ug via INTRAVENOUS

## 2022-02-08 MED ORDER — BUPIVACAINE LIPOSOME 1.3 % IJ SUSP
INTRAMUSCULAR | Status: DC | PRN
  Administered 2022-02-08: 20 mL

## 2022-02-08 MED ORDER — SODIUM CHLORIDE 0.9 % IV SOLN
2.0000 g | INTRAVENOUS | Status: AC
  Administered 2022-02-08: 2 g via INTRAVENOUS
  Filled 2022-02-08: qty 2

## 2022-02-08 MED ORDER — ONDANSETRON HCL 4 MG PO TABS
4.0000 mg | ORAL_TABLET | Freq: Four times a day (QID) | ORAL | Status: DC | PRN
Start: 2022-02-08 — End: 2022-02-10

## 2022-02-08 MED ORDER — SODIUM CHLORIDE 0.9 % IR SOLN
Status: DC | PRN
  Administered 2022-02-08: 1000 mL via INTRAVESICAL

## 2022-02-08 MED ORDER — KETAMINE HCL 10 MG/ML IJ SOLN
INTRAMUSCULAR | Status: DC | PRN
  Administered 2022-02-08: 30 mg via INTRAVENOUS

## 2022-02-08 MED ORDER — MIDAZOLAM HCL 2 MG/2ML IJ SOLN
INTRAMUSCULAR | Status: AC
  Filled 2022-02-08: qty 2

## 2022-02-08 MED ORDER — POLYETHYLENE GLYCOL 3350 17 GM/SCOOP PO POWD: 1.0000 | Freq: Once | ORAL | Status: DC

## 2022-02-08 MED ORDER — MELATONIN 3 MG PO TABS
3.0000 mg | ORAL_TABLET | Freq: Every evening | ORAL | Status: DC | PRN
  Filled 2022-02-08: qty 1

## 2022-02-08 MED ORDER — KETAMINE HCL 10 MG/ML IJ SOLN
INTRAMUSCULAR | Status: AC
  Filled 2022-02-08: qty 1

## 2022-02-08 MED ORDER — 0.9 % SODIUM CHLORIDE (POUR BTL) OPTIME
TOPICAL | Status: DC | PRN
  Administered 2022-02-08: 2000 mL

## 2022-02-08 MED ORDER — ONDANSETRON HCL 4 MG/2ML IJ SOLN: 4.0000 mg | Freq: Four times a day (QID) | INTRAMUSCULAR | Status: DC | PRN

## 2022-02-08 MED ORDER — ENSURE PRE-SURGERY PO LIQD
592.0000 mL | Freq: Once | ORAL | Status: DC
  Filled 2022-02-08: qty 592

## 2022-02-08 MED ORDER — LACTATED RINGERS IV SOLN: INTRAVENOUS | Status: DC

## 2022-02-08 MED ORDER — ALVIMOPAN 12 MG PO CAPS
12.0000 mg | ORAL_CAPSULE | ORAL | Status: AC
  Administered 2022-02-08: 12 mg via ORAL
  Filled 2022-02-08: qty 1

## 2022-02-08 MED ORDER — SUGAMMADEX SODIUM 200 MG/2ML IV SOLN
INTRAVENOUS | Status: DC | PRN
  Administered 2022-02-08: 200 mg via INTRAVENOUS

## 2022-02-08 MED ORDER — DIPHENHYDRAMINE HCL 12.5 MG/5ML PO ELIX: 12.5000 mg | ORAL_SOLUTION | Freq: Four times a day (QID) | ORAL | Status: DC | PRN

## 2022-02-08 MED ORDER — PROCHLORPERAZINE MALEATE 10 MG PO TABS: 10.0000 mg | ORAL_TABLET | Freq: Four times a day (QID) | ORAL | Status: DC | PRN

## 2022-02-08 MED ORDER — ROCURONIUM BROMIDE 10 MG/ML (PF) SYRINGE
PREFILLED_SYRINGE | INTRAVENOUS | Status: DC | PRN
  Administered 2022-02-08 (×2): 10 mg via INTRAVENOUS
  Administered 2022-02-08: 70 mg via INTRAVENOUS
  Administered 2022-02-08: 10 mg via INTRAVENOUS
  Administered 2022-02-08: 20 mg via INTRAVENOUS

## 2022-02-08 MED ORDER — STERILE WATER FOR INJECTION IJ SOLN
INTRAMUSCULAR | Status: AC
  Filled 2022-02-08: qty 10

## 2022-02-08 MED ORDER — ENOXAPARIN SODIUM 40 MG/0.4ML IJ SOSY
40.0000 mg | PREFILLED_SYRINGE | Freq: Once | INTRAMUSCULAR | Status: AC
  Administered 2022-02-08: 40 mg via SUBCUTANEOUS
  Filled 2022-02-08: qty 0.4

## 2022-02-08 MED ORDER — LIP MEDEX EX OINT
TOPICAL_OINTMENT | Freq: Two times a day (BID) | CUTANEOUS | Status: DC
  Administered 2022-02-08: 75 via TOPICAL
  Filled 2022-02-08: qty 7

## 2022-02-08 MED ORDER — ACETAMINOPHEN 500 MG PO TABS
1000.0000 mg | ORAL_TABLET | ORAL | Status: DC
  Filled 2022-02-08: qty 2

## 2022-02-08 MED ORDER — ORAL CARE MOUTH RINSE: 15.0000 mL | Freq: Once | OROMUCOSAL | Status: AC

## 2022-02-08 MED ORDER — FENTANYL CITRATE (PF) 250 MCG/5ML IJ SOLN
INTRAMUSCULAR | Status: AC
  Filled 2022-02-08: qty 5

## 2022-02-08 MED ORDER — PROCHLORPERAZINE EDISYLATE 10 MG/2ML IJ SOLN: 5.0000 mg | Freq: Four times a day (QID) | INTRAMUSCULAR | Status: DC | PRN

## 2022-02-08 MED ORDER — NEOMYCIN SULFATE 500 MG PO TABS: 1000.0000 mg | ORAL_TABLET | ORAL | Status: DC

## 2022-02-08 MED ORDER — HYDROMORPHONE HCL 1 MG/ML IJ SOLN
0.2500 mg | INTRAMUSCULAR | Status: DC | PRN
  Administered 2022-02-08 (×4): 0.5 mg via INTRAVENOUS

## 2022-02-08 MED ORDER — TRAMADOL HCL 50 MG PO TABS
50.0000 mg | ORAL_TABLET | Freq: Four times a day (QID) | ORAL | Status: DC | PRN
  Administered 2022-02-09: 100 mg via ORAL
  Filled 2022-02-08 (×2): qty 2

## 2022-02-08 MED ORDER — ENOXAPARIN SODIUM 40 MG/0.4ML IJ SOSY
40.0000 mg | PREFILLED_SYRINGE | INTRAMUSCULAR | Status: DC
Start: 2022-02-09 — End: 2022-02-10
  Administered 2022-02-09 – 2022-02-10 (×2): 40 mg via SUBCUTANEOUS
  Filled 2022-02-08 (×2): qty 0.4

## 2022-02-08 MED ORDER — LACTATED RINGERS IV SOLN: INTRAVENOUS | Status: DC | PRN

## 2022-02-08 MED ORDER — ENSURE PRE-SURGERY PO LIQD
296.0000 mL | Freq: Once | ORAL | Status: DC
  Filled 2022-02-08: qty 296

## 2022-02-08 MED ORDER — ACETAMINOPHEN 500 MG PO TABS: 1000.0000 mg | ORAL_TABLET | Freq: Once | ORAL | Status: DC

## 2022-02-08 MED ORDER — MIDAZOLAM HCL 5 MG/5ML IJ SOLN
INTRAMUSCULAR | Status: DC | PRN
  Administered 2022-02-08: 2 mg via INTRAVENOUS

## 2022-02-08 MED ORDER — PROPOFOL 10 MG/ML IV BOLUS
INTRAVENOUS | Status: AC
  Filled 2022-02-08: qty 20

## 2022-02-08 MED ORDER — HYDRALAZINE HCL 20 MG/ML IJ SOLN: 10.0000 mg | INTRAMUSCULAR | Status: DC | PRN

## 2022-02-08 MED ORDER — MAGIC MOUTHWASH: 15.0000 mL | Freq: Four times a day (QID) | ORAL | Status: DC | PRN

## 2022-02-08 MED ORDER — HYDROMORPHONE HCL 1 MG/ML IJ SOLN
0.5000 mg | INTRAMUSCULAR | Status: DC | PRN
  Administered 2022-02-08 – 2022-02-09 (×4): 1 mg via INTRAVENOUS
  Filled 2022-02-08 (×4): qty 1

## 2022-02-08 MED ORDER — ALVIMOPAN 12 MG PO CAPS
12.0000 mg | ORAL_CAPSULE | Freq: Two times a day (BID) | ORAL | Status: DC
  Filled 2022-02-08 (×2): qty 1

## 2022-02-08 MED ORDER — ROCURONIUM BROMIDE 10 MG/ML (PF) SYRINGE
PREFILLED_SYRINGE | INTRAVENOUS | Status: AC
Start: 2022-02-08 — End: ?
  Filled 2022-02-08: qty 10

## 2022-02-08 MED ORDER — ETOMIDATE 2 MG/ML IV SOLN
INTRAVENOUS | Status: DC | PRN
  Administered 2022-02-08: 14 mg via INTRAVENOUS

## 2022-02-08 MED ORDER — BUPIVACAINE-EPINEPHRINE (PF) 0.25% -1:200000 IJ SOLN
INTRAMUSCULAR | Status: DC | PRN
  Administered 2022-02-08: 30 mL

## 2022-02-08 SURGICAL SUPPLY — 131 items
ADAPTER GOLDBERG URETERAL (ADAPTER) IMPLANT
APPLIER CLIP 5 13 M/L LIGAMAX5 (MISCELLANEOUS)
APPLIER CLIP ROT 10 11.4 M/L (STAPLE)
BAG COUNTER SPONGE SURGICOUNT (BAG) ×2 IMPLANT
BAG URO CATCHER STRL LF (MISCELLANEOUS) ×2 IMPLANT
BLADE EXTENDED COATED 6.5IN (ELECTRODE) IMPLANT
CANNULA REDUC XI 12-8 STAPL (CANNULA)
CANNULA REDUCER 12-8 DVNC XI (CANNULA) IMPLANT
CATH URETL OPEN END 6FR 70 (CATHETERS) ×1 IMPLANT
CELLS DAT CNTRL 66122 CELL SVR (MISCELLANEOUS) IMPLANT
CHLORAPREP W/TINT 26 (MISCELLANEOUS) IMPLANT
CLIP APPLIE 5 13 M/L LIGAMAX5 (MISCELLANEOUS) IMPLANT
CLIP APPLIE ROT 10 11.4 M/L (STAPLE) IMPLANT
CLOTH BEACON ORANGE TIMEOUT ST (SAFETY) ×2 IMPLANT
COVER SURGICAL LIGHT HANDLE (MISCELLANEOUS) ×4 IMPLANT
COVER TIP SHEARS 8 DVNC (MISCELLANEOUS) ×1 IMPLANT
COVER TIP SHEARS 8MM DA VINCI (MISCELLANEOUS) ×2
DEVICE TROCAR PUNCTURE CLOSURE (ENDOMECHANICALS) IMPLANT
DRAIN CHANNEL 19F RND (DRAIN) IMPLANT
DRAPE ARM DVNC X/XI (DISPOSABLE) ×4 IMPLANT
DRAPE COLUMN DVNC XI (DISPOSABLE) ×1 IMPLANT
DRAPE DA VINCI XI ARM (DISPOSABLE) ×8
DRAPE DA VINCI XI COLUMN (DISPOSABLE) ×2
DRAPE SURG IRRIG POUCH 19X23 (DRAPES) ×2 IMPLANT
DRSG IV TEGADERM 3.5X4.5 STRL (GAUZE/BANDAGES/DRESSINGS) ×1 IMPLANT
DRSG OPSITE POSTOP 4X10 (GAUZE/BANDAGES/DRESSINGS) IMPLANT
DRSG OPSITE POSTOP 4X6 (GAUZE/BANDAGES/DRESSINGS) ×1 IMPLANT
DRSG OPSITE POSTOP 4X8 (GAUZE/BANDAGES/DRESSINGS) IMPLANT
DRSG TEGADERM 2-3/8X2-3/4 SM (GAUZE/BANDAGES/DRESSINGS) ×10 IMPLANT
DRSG TEGADERM 4X4.75 (GAUZE/BANDAGES/DRESSINGS) IMPLANT
ELECT PENCIL ROCKER SW 15FT (MISCELLANEOUS) ×2 IMPLANT
ELECT REM PT RETURN 15FT ADLT (MISCELLANEOUS) ×2 IMPLANT
ENDOLOOP SUT PDS II  0 18 (SUTURE)
ENDOLOOP SUT PDS II 0 18 (SUTURE) IMPLANT
EVACUATOR SILICONE 100CC (DRAIN) IMPLANT
GAUZE SPONGE 2X2 8PLY STRL LF (GAUZE/BANDAGES/DRESSINGS) ×1 IMPLANT
GLOVE ECLIPSE 8.0 STRL XLNG CF (GLOVE) ×6 IMPLANT
GLOVE INDICATOR 8.0 STRL GRN (GLOVE) ×6 IMPLANT
GLOVE SURG LX 7.5 STRW (GLOVE) ×1
GLOVE SURG LX STRL 7.5 STRW (GLOVE) ×1 IMPLANT
GOWN SRG XL LVL 4 BRTHBL STRL (GOWNS) ×1 IMPLANT
GOWN STRL NON-REIN XL LVL4 (GOWNS) ×2
GOWN STRL REUS W/ TWL XL LVL3 (GOWN DISPOSABLE) ×5 IMPLANT
GOWN STRL REUS W/TWL XL LVL3 (GOWN DISPOSABLE) ×10
GRASPER SUT TROCAR 14GX15 (MISCELLANEOUS) IMPLANT
GUIDEWIRE ANG ZIPWIRE 038X150 (WIRE) IMPLANT
GUIDEWIRE STR DUAL SENSOR (WIRE) IMPLANT
HOLDER FOLEY CATH W/STRAP (MISCELLANEOUS) ×2 IMPLANT
IRRIG SUCT STRYKERFLOW 2 WTIP (MISCELLANEOUS) ×2
IRRIGATION SUCT STRKRFLW 2 WTP (MISCELLANEOUS) ×1 IMPLANT
KIT PROCEDURE DA VINCI SI (MISCELLANEOUS) ×2
KIT PROCEDURE DVNC SI (MISCELLANEOUS) ×1 IMPLANT
KIT SIGMOIDOSCOPE (SET/KITS/TRAYS/PACK) ×1 IMPLANT
KIT TURNOVER KIT A (KITS) IMPLANT
MANIFOLD NEPTUNE II (INSTRUMENTS) ×2 IMPLANT
NDL INSUFFLATION 14GA 120MM (NEEDLE) ×1 IMPLANT
NEEDLE INSUFFLATION 14GA 120MM (NEEDLE) ×2 IMPLANT
OBTURATOR OPTICAL STANDARD 8MM (TROCAR) ×2
OBTURATOR OPTICAL STND 8 DVNC (TROCAR) ×1
OBTURATOR OPTICALSTD 8 DVNC (TROCAR) IMPLANT
PACK CARDIOVASCULAR III (CUSTOM PROCEDURE TRAY) ×2 IMPLANT
PACK COLON (CUSTOM PROCEDURE TRAY) ×2 IMPLANT
PACK CYSTO (CUSTOM PROCEDURE TRAY) ×2 IMPLANT
PAD POSITIONING PINK XL (MISCELLANEOUS) ×2 IMPLANT
PROTECTOR NERVE ULNAR (MISCELLANEOUS) ×4 IMPLANT
RELOAD STAPLE 45 3.5 BLU DVNC (STAPLE) IMPLANT
RELOAD STAPLE 45 4.3 GRN DVNC (STAPLE) IMPLANT
RELOAD STAPLE 60 3.5 BLU DVNC (STAPLE) IMPLANT
RELOAD STAPLE 60 4.3 GRN DVNC (STAPLE) IMPLANT
RELOAD STAPLER 3.5X45 BLU DVNC (STAPLE) IMPLANT
RELOAD STAPLER 3.5X60 BLU DVNC (STAPLE) IMPLANT
RELOAD STAPLER 4.3X45 GRN DVNC (STAPLE) IMPLANT
RELOAD STAPLER 4.3X60 GRN DVNC (STAPLE) IMPLANT
RETRACTOR WND ALEXIS 18 MED (MISCELLANEOUS) IMPLANT
RTRCTR WOUND ALEXIS 18CM MED (MISCELLANEOUS)
SCISSORS LAP 5X35 DISP (ENDOMECHANICALS) ×2 IMPLANT
SEAL CANN UNIV 5-8 DVNC XI (MISCELLANEOUS) ×3 IMPLANT
SEAL XI 5MM-8MM UNIVERSAL (MISCELLANEOUS) ×3
SEALER VESSEL DA VINCI XI (MISCELLANEOUS) ×2
SEALER VESSEL EXT DVNC XI (MISCELLANEOUS) ×1 IMPLANT
SOLUTION ELECTROLUBE (MISCELLANEOUS) ×2 IMPLANT
SPIKE FLUID TRANSFER (MISCELLANEOUS) ×2 IMPLANT
SPONGE GAUZE 2X2 STER 10/PKG (GAUZE/BANDAGES/DRESSINGS) ×1
STAPLER 45 DA VINCI SURE FORM (STAPLE)
STAPLER 45 SUREFORM DVNC (STAPLE) IMPLANT
STAPLER 60 DA VINCI SURE FORM (STAPLE)
STAPLER 60 SUREFORM DVNC (STAPLE) IMPLANT
STAPLER CANNULA SEAL DVNC XI (STAPLE) ×1 IMPLANT
STAPLER CANNULA SEAL XI (STAPLE) ×1
STAPLER ECHELON POWER CIR 29 (STAPLE) IMPLANT
STAPLER ECHELON POWER CIR 31 (STAPLE) ×1 IMPLANT
STAPLER RELOAD 3.5X45 BLU DVNC (STAPLE)
STAPLER RELOAD 3.5X45 BLUE (STAPLE)
STAPLER RELOAD 3.5X60 BLU DVNC (STAPLE)
STAPLER RELOAD 3.5X60 BLUE (STAPLE)
STAPLER RELOAD 4.3X45 GREEN (STAPLE)
STAPLER RELOAD 4.3X45 GRN DVNC (STAPLE)
STAPLER RELOAD 4.3X60 GREEN (STAPLE)
STAPLER RELOAD 4.3X60 GRN DVNC (STAPLE)
STOPCOCK 4 WAY LG BORE MALE ST (IV SETS) ×4 IMPLANT
SURGILUBE 2OZ TUBE FLIPTOP (MISCELLANEOUS) IMPLANT
SUT MNCRL AB 4-0 PS2 18 (SUTURE) ×3 IMPLANT
SUT PDS AB 1 CT1 27 (SUTURE) ×4 IMPLANT
SUT PROLENE 0 CT 2 (SUTURE) ×1 IMPLANT
SUT PROLENE 2 0 KS (SUTURE) ×1 IMPLANT
SUT PROLENE 2 0 SH DA (SUTURE) IMPLANT
SUT SILK 2 0 (SUTURE) ×2
SUT SILK 2 0 SH CR/8 (SUTURE) IMPLANT
SUT SILK 2-0 18XBRD TIE 12 (SUTURE) IMPLANT
SUT SILK 3 0 (SUTURE) ×1
SUT SILK 3 0 SH CR/8 (SUTURE) ×2 IMPLANT
SUT SILK 3-0 18XBRD TIE 12 (SUTURE) IMPLANT
SUT V-LOC BARB 180 2/0GR6 GS22 (SUTURE)
SUT VIC AB 3-0 SH 18 (SUTURE) IMPLANT
SUT VIC AB 3-0 SH 27 (SUTURE)
SUT VIC AB 3-0 SH 27XBRD (SUTURE) IMPLANT
SUT VICRYL 0 UR6 27IN ABS (SUTURE) ×2 IMPLANT
SUT VLOC BARB 180 ABS3/0GR12 (SUTURE) ×2
SUTURE V-LC BRB 180 2/0GR6GS22 (SUTURE) IMPLANT
SUTURE VLOC BRB 180 ABS3/0GR12 (SUTURE) IMPLANT
SYR 10ML ECCENTRIC (SYRINGE) ×2 IMPLANT
SYS LAPSCP GELPORT 120MM (MISCELLANEOUS)
SYS WOUND ALEXIS 18CM MED (MISCELLANEOUS) ×2
SYSTEM LAPSCP GELPORT 120MM (MISCELLANEOUS) IMPLANT
SYSTEM WOUND ALEXIS 18CM MED (MISCELLANEOUS) ×1 IMPLANT
TOWEL OR NON WOVEN STRL DISP B (DISPOSABLE) ×2 IMPLANT
TRAY FOLEY MTR SLVR 16FR STAT (SET/KITS/TRAYS/PACK) ×2 IMPLANT
TROCAR ADV FIXATION 5X100MM (TROCAR) ×2 IMPLANT
TUBING CONNECTING 10 (TUBING) ×6 IMPLANT
TUBING INSUFFLATION 10FT LAP (TUBING) ×2 IMPLANT
TUBING UROLOGY SET (TUBING) IMPLANT
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
## 2022-02-08 NOTE — Transfer of Care (Signed)
Immediate Anesthesia Transfer of Care Note  Patient: Jonathan [name]  Procedure(s) Performed: XI ROBOTIC ASSISTED COLOSTOMY TAKEDOWN LYSIS OF ADHESION RIGID PROCTOSCOPY CYSTOSCOPY with FIREFLY INJECTION  Patient Location: PACU  Anesthesia Type:General  Level of Consciousness: awake, alert , oriented and patient cooperative  Airway & Oxygen Therapy: Patient Spontanous Breathing and Patient connected to face mask oxygen  Post-op Assessment: Report given to RN, Post -op Vital signs reviewed and stable and Patient moving all extremities X 4  Post vital signs: Reviewed and stable  Last Vitals:  Vitals Value Taken Time  BP 144/81   Temp    Pulse 99 02/08/22 1642  Resp    SpO2 100 % 02/08/22 1642  Vitals shown include unvalidated device data.  Last Pain:  Vitals:   02/08/22 1037  TempSrc:   PainSc: 0-No pain         Complications: No notable events documented.
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
## 2022-02-08 NOTE — Anesthesia Postprocedure Evaluation (Signed)
Anesthesia Post Note  Patient: Jonathan [name]  Procedure(s) Performed: XI ROBOTIC ASSISTED COLOSTOMY TAKEDOWN LYSIS OF ADHESION RIGID PROCTOSCOPY CYSTOSCOPY with FIREFLY INJECTION     Patient location during evaluation: PACU Anesthesia Type: General Level of consciousness: awake and alert Pain management: pain level controlled Vital Signs Assessment: post-procedure vital signs reviewed and stable Respiratory status: spontaneous breathing, nonlabored ventilation, respiratory function stable and patient connected to nasal cannula oxygen Cardiovascular status: blood pressure returned to baseline and stable Postop Assessment: no apparent nausea or vomiting Anesthetic complications: no   No notable events documented.  Last Vitals:  Vitals:   02/08/22 1745 02/08/22 1805  BP:  (!) 140/79  Pulse: (!) 101 96  Resp: 14 18  Temp:  37 C  SpO2: 99% 100%    Last Pain:  Vitals:   02/08/22 1805  TempSrc: Oral  PainSc:                  Mahati Vajda L Reakwon Barren
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
## 2022-02-08 NOTE — Anesthesia Procedure Notes (Signed)
Procedure Name: Intubation Date/Time: 02/08/2022 12:35 PM  Performed by: Lavina Hamman, CRNAPre-anesthesia Checklist: Patient identified, Emergency Drugs available, Suction available, Patient being monitored and Timeout performed Patient Re-evaluated:Patient Re-evaluated prior to induction Oxygen Delivery Method: Circle system utilized Preoxygenation: Pre-oxygenation with 100% oxygen Induction Type: IV induction Ventilation: Mask ventilation without difficulty Laryngoscope Size: Mac and 4 Grade View: Grade I Tube type: Oral Tube size: 7.0 mm Number of attempts: 1 Airway Equipment and Method: Stylet Placement Confirmation: ETT inserted through vocal cords under direct vision, positive ETCO2, CO2 detector and breath sounds checked- equal and bilateral Secured at: 22 cm Tube secured with: Tape Dental Injury: Teeth and Oropharynx as per pre-operative assessment  Comments: ATOI

## 2022-02-09 ENCOUNTER — Other Ambulatory Visit: Payer: Self-pay

## 2022-02-09 ENCOUNTER — Encounter (HOSPITAL_COMMUNITY): Payer: Self-pay | Admitting: Surgery

## 2022-02-09 LAB — BASIC METABOLIC PANEL
Anion gap: 9 (ref 5–15)
BUN: 9 mg/dL (ref 6–20)
CO2: 26 mmol/L (ref 22–32)
Calcium: 9.3 mg/dL (ref 8.9–10.3)
Chloride: 105 mmol/L (ref 98–111)
Creatinine, Ser: 0.86 mg/dL (ref 0.61–1.24)
GFR, Estimated: >60 mL/min (ref >60)
Glucose, Bld: 122 mg/dL — ABNORMAL HIGH (ref 70–99)
Potassium: 4.6 mmol/L (ref 3.5–5.1)
Sodium: 140 mmol/L (ref 135–145)

## 2022-02-09 LAB — CBC
HCT: 36.9 % — ABNORMAL LOW (ref 39.0–52.0)
Hemoglobin: 11.6 g/dL — ABNORMAL LOW (ref 13.0–17.0)
MCH: 30.1 pg (ref 26.0–34.0)
MCHC: 31.4 g/dL (ref 30.0–36.0)
MCV: 95.8 fL (ref 80.0–100.0)
Platelets: 274 10*3/uL (ref 150–400)
RBC: 3.85 MIL/uL — ABNORMAL LOW (ref 4.22–5.81)
RDW: 12 % (ref 11.5–15.5)
WBC: 14.1 10*3/uL — ABNORMAL HIGH (ref 4.0–10.5)
nRBC: 0 % (ref 0.0–0.2)

## 2022-02-09 LAB — MAGNESIUM: Magnesium: 1.8 mg/dL (ref 1.7–2.4)

## 2022-02-09 MED ORDER — GABAPENTIN 400 MG PO CAPS
800.0000 mg | ORAL_CAPSULE | Freq: Three times a day (TID) | ORAL | Status: DC
  Administered 2022-02-09 – 2022-02-10 (×4): 800 mg via ORAL
  Filled 2022-02-09 (×4): qty 2

## 2022-02-09 MED ORDER — OXYCODONE HCL 5 MG PO TABS: 5.0000 mg | ORAL_TABLET | ORAL | Status: DC | PRN

## 2022-02-09 NOTE — Progress Notes (Signed)
OT Cancellation Note  Patient Details Name: Jonathan [name] MRN: [id number] DOB: 19-Jun-2001   Cancelled Treatment:    Reason Eval/Treat Not Completed: OT screened, no needs identified, will sign off. Patient reports independence in room with ADLs and ambulation. Reports he has no OT/PT needs.  Shirlee Whitmire L Joanne Brander 02/09/2022, 9:42 AM

## 2022-02-09 NOTE — Progress Notes (Signed)
PT Cancellation Note  Patient Details Name: Jonathan [name] MRN: [id number] DOB: 03-25-2001   Cancelled Treatment:     PT order received but eval deferred.  Pt advises he has been moving around in room and to bathroom using scooter without difficulty.  Pt states he is at or near his baseline with mobility and feels he has no PT needs at this time.  PT service will sign off.   Denine Brotz 02/09/2022, 10:25 AM

## 2022-02-09 NOTE — Progress Notes (Signed)
Jonathan [name] [id number] Nov 09, 2000  CARE TEAM:  PCP: Belva Agee, MD  Outpatient Care Team: Patient Care Team: Belva Agee, MD as PCP - General (Internal Medicine) Heloise Purpura, MD as Consulting Physician (Urology) Violeta Gelinas, MD as Consulting Physician (Trauma Surgery) Karie Soda, MD as Consulting Physician (Colon and Rectal Surgery) Rich Reining as Social Worker  Inpatient Treatment Team: Treatment Team: Attending Provider: Karie Soda, MD; Charge Nurse: Hilliard Clark, RN; Technician: Fuller Canada, NT; Registered Nurse: Renard Hamper, RN   Problem List:   Principal Problem:   Traumatic perforation of rectum   1 Day Post-Op  02/08/2022    POST-OPERATIVE DIAGNOSIS:  COLOSTOMY FOR COLON RESECTION, DESIRE FOR OSTOMY TAKEDOWN   PROCEDURE:   ROBOTIC ASSISTED COLOSTOMY TAKEDOWN MOBILIZATION OF SPLENIC FLEXURE OF COLON ROBOTIC LYSIS OF ADHESIONS X 2 HOURS (2/3 CASE) INTRAOPERATIVE ASSESSMENT OF TISSUE VASCULAR PERFUSION USING ICG (indocyanine green) IMMUNOFLUORESCENCE TRANSVERSUS ABDOMINIS PLANE (TAP) BLOCK - BILATERAL RIGID PROCTOSCOPY   SURGEON:  Ardeth Sportsman, MD   OR FINDINGS:    Patient had very dense but soft adhesions of omentum and small bowel to anterior domino wall retroperitoneum and pelvis.   No obvious metastatic disease on visceral parietal peritoneum or liver.   The anastomosis rests 9 cm from the anal verge by rigid proctoscopy.  It is a descending colon to mid rectal 31 EEA anastomosis     Assessment  Recovering  [facility] Stay = 1 days)  Plan:  -ERAS pathway -Multimodal pain control.  Resuming home gabapentin regimen.  Continue scheduled Tylenol.  Ice/heat.  As needed breakthrough opiates and muscle relaxers as needed. -adv diet gradually -Medlock IV fluids with as needed backup -DC Foley.  I&O cath as needed. -VTE prophylaxis- SCDs, etc -mobilize as tolerated to help  recovery  Disposition:  Disposition:  The patient is from: Home  Anticipate discharge to:  Home  Anticipated Date of Discharge is:  August 5,2023    Barriers to discharge:  Pending Clinical improvement (more likely than not)  Patient currently is NOT MEDICALLY STABLE for discharge from the hospital from a surgery standpoint.      I reviewed last 24 h vitals and pain scores, last 48 h intake and output, last 24 h labs and trends, and last 24 h imaging results. I have reviewed this patient's available data, including medical history, events of note, test results, etc as part of my evaluation.  A significant portion of that time was spent in counseling.  Care during the described time interval was provided by me.  This care required moderate level of medical decision making.  02/09/2022    Subjective: (Chief complaint)  Some soreness control.  Mobilize to bathroom  Tolerating liquids.  Had bowel movement.  Objective:  Vital signs:  Vitals:   02/08/22 2111 02/09/22 0053 02/09/22 0500 02/09/22 0553  BP: 134/65 124/66  124/67  Pulse: 85 76  75  Resp: 18 16  18   Temp: 97.8 F (36.6 C) 98.8 F (37.1 C)  98.3 F (36.8 C)  TempSrc: Oral Oral  Oral  SpO2: 99% 99%  99%  Weight:   71.4 kg   Height:        Last BM Date : 02/08/22  Intake/Output   Yesterday:  08/03 0701 - 08/04 0700 In: 3270 [P.O.:180; I.V.:2780; IV Piggyback:310] Out: 4350 [Urine:4300; Blood:50] This shift:  Total I/O In: 778.3 [P.O.:120; I.V.:558.3; IV Piggyback:100] Out: 3300 [Urine:3300]  Bowel function:  Flatus: YES  BM:  YES  Drain: (No drain)   Physical Exam:  General: Pt awake/alert in no acute distress Eyes: PERRL, normal EOM.  Sclera clear.  No icterus Neuro: CN II-XII intact w/o focal sensory/motor deficits. Lymph: No head/neck/groin lymphadenopathy Psych:  No delerium/psychosis/paranoia.  Oriented x 4 HENT: Normocephalic, Mucus membranes moist.  No thrush Neck: Supple, No  tracheal deviation.  No obvious thyromegaly Chest: No pain to chest wall compression.  Good respiratory excursion.  No audible wheezing CV:  Pulses intact.  Regular rhythm.  No major extremity edema MS: Normal AROM mjr joints.  No obvious deformity  Abdomen: Soft.  Nondistended.  Mildly tender at incisions only.  No evidence of peritonitis.  No incarcerated hernias.  Ext:  No deformity.  No mjr edema.  No cyanosis.  Still with some left lower extremity weakness which is his baseline. Skin: No petechiae / purpurea.  No major sores.  Warm and dry    Results:   Cultures: No results found for this or any previous visit (from the past 720 hour(s)).  Labs: Results for orders placed or performed during the hospital encounter of 02/08/22 (from the past 48 hour(s))  Type and screen [facility]     Status: None   Collection Time: 02/08/22 10:50 AM  Result Value Ref Range   ABO/RH(D) B POS    Antibody Screen NEG    Sample Expiration      02/11/2022,2359 Performed at [facility], [address] [place], [place] [address]   Basic metabolic panel     Status: Abnormal   Collection Time: 02/09/22  3:05 AM  Result Value Ref Range   Sodium 140 135 - 145 mmol/L   Potassium 4.6 3.5 - 5.1 mmol/L   Chloride 105 98 - 111 mmol/L   CO2 26 22 - 32 mmol/L   Glucose, Bld 122 (H) 70 - 99 mg/dL    Comment: Glucose reference range applies only to samples taken after fasting for at least 8 hours.   BUN 9 6 - 20 mg/dL   Creatinine, Ser 6.29 0.61 - 1.24 mg/dL   Calcium 9.3 8.9 - 52.8 mg/dL   GFR, Estimated >41 >32 mL/min    Comment: (NOTE) Calculated using the CKD-EPI Creatinine Equation (2021)    Anion gap 9 5 - 15    Comment: Performed at [facility], [address] [place], [place] [address]  CBC     Status: Abnormal   Collection Time: 02/09/22  3:05 AM  Result Value Ref Range   WBC 14.1 (H) 4.0 - 10.5 K/uL   RBC 3.85 (L) 4.22 - 5.81  MIL/uL   Hemoglobin 11.6 (L) 13.0 - 17.0 g/dL   HCT 27.2 (L) 53.6 - 64.4 %   MCV 95.8 80.0 - 100.0 fL   MCH 30.1 26.0 - 34.0 pg   MCHC 31.4 30.0 - 36.0 g/dL   RDW 03.4 74.2 - 59.5 %   Platelets 274 150 - 400 K/uL   nRBC 0.0 0.0 - 0.2 %    Comment: Performed at [facility], [address] [place], [place] [address]  Magnesium     Status: None   Collection Time: 02/09/22  3:05 AM  Result Value Ref Range   Magnesium 1.8 1.7 - 2.4 mg/dL    Comment: Performed at [facility], [address], [place], [place] [address]    Imaging / Studies: DG C-Arm 1-60 Min-No Report  Result Date: 02/08/2022 Fluoroscopy was utilized by  the requesting physician.  No radiographic interpretation.    Medications / Allergies: per chart  Antibiotics: Anti-infectives (From admission, onward)    Start     Dose/Rate Route Frequency Ordered Stop   02/08/22 2200  cefoTEtan (CEFOTAN) 2 g in sodium chloride 0.9 % 100 mL IVPB        2 g 200 mL/hr over 30 Minutes Intravenous Every 12 hours 02/08/22 1800 02/08/22 2229   02/08/22 1400  neomycin (MYCIFRADIN) tablet 1,000 mg  Status:  Discontinued       See Hyperspace for full Linked Orders Report.   1,000 mg Oral 3 times per day 02/08/22 0940 02/08/22 0941   02/08/22 1400  metroNIDAZOLE (FLAGYL) tablet 1,000 mg  Status:  Discontinued       See Hyperspace for full Linked Orders Report.   1,000 mg Oral 3 times per day 02/08/22 0940 02/08/22 0941   02/08/22 0945  cefoTEtan (CEFOTAN) 2 g in sodium chloride 0.9 % 100 mL IVPB        2 g 200 mL/hr over 30 Minutes Intravenous On call to O.R. 02/08/22 0940 02/08/22 1223         Note: Portions of this report may have been transcribed using voice recognition software. Every effort was made to ensure accuracy; however, inadvertent computerized transcription errors may be present.   Any transcriptional errors that result from this process are unintentional.    Ardeth Sportsman,  MD, FACS, MASCRS Esophageal, Gastrointestinal & Colorectal Surgery Robotic and Minimally Invasive Surgery  [facility] [address] [place], [place] [address] [phone] Fax [phone] Main  CONTACT INFORMATION:  Weekday (9AM-5PM): Call [facility] main office at [phone]  Weeknight (5PM-9AM) or Weekend/Holiday: Check [email or web address] (password " [email or web address]") for General Surgery [facility] coverage  (Please, do not use SecureChat as it is not reliable communication to reach operating surgeons for immediate patient care)      02/09/2022  6:53 AM

## 2022-02-09 NOTE — Progress Notes (Signed)
Patient initially wanted SCDs over night but later refused them. Patient was able to get up to use the bathroom independently and using his personal assistive device to help get around.   Foley has been discontinued as well.

## 2022-02-10 MED ORDER — OXYCODONE HCL 5 MG PO TABS: 5.0000 mg | ORAL_TABLET | Freq: Four times a day (QID) | ORAL | 0 refills | Status: DC | PRN

## 2022-02-10 NOTE — Progress Notes (Signed)
Nurse reviewed discharge instructions with pt.  Pt verbalized understanding of discharge instructions, follow up appointments, new medications and incisional care.  No concerns at time of discharge.  Pt mother came to pick pt up from hospital.
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
## 2022-02-12 LAB — SURGICAL PATHOLOGY

## 2022-02-14 ENCOUNTER — Encounter: Payer: 59 | Attending: Physical Medicine and Rehabilitation | Admitting: Physical Medicine and Rehabilitation

## 2022-02-14 DIAGNOSIS — G8310 Monoplegia of lower limb affecting unspecified side: Secondary | ICD-10-CM | POA: Insufficient documentation

## 2022-02-19 ENCOUNTER — Encounter (HOSPITAL_COMMUNITY): Payer: Self-pay | Admitting: Emergency Medicine

## 2022-02-19 ENCOUNTER — Ambulatory Visit (HOSPITAL_COMMUNITY)
Admission: EM | Admit: 2022-02-19 | Discharge: 2022-02-19 | Disposition: A | Discharge status: Home / Self Care | Payer: 59

## 2022-02-19 ENCOUNTER — Emergency Department (HOSPITAL_COMMUNITY)
Admission: EM | Admit: 2022-02-19 | Discharge: 2022-02-19 | Discharge status: Against Medical Advice | Payer: 59 | Attending: Emergency Medicine | Admitting: Emergency Medicine

## 2022-02-19 ENCOUNTER — Encounter (HOSPITAL_COMMUNITY): Payer: Self-pay

## 2022-02-19 DIAGNOSIS — Z5321 Procedure and treatment not carried out due to patient leaving prior to being seen by health care provider: Secondary | ICD-10-CM | POA: Diagnosis not present

## 2022-02-19 DIAGNOSIS — R197 Diarrhea, unspecified: Secondary | ICD-10-CM | POA: Diagnosis not present

## 2022-02-19 DIAGNOSIS — E860 Dehydration: Secondary | ICD-10-CM

## 2022-02-19 DIAGNOSIS — R1030 Lower abdominal pain, unspecified: Secondary | ICD-10-CM | POA: Diagnosis not present

## 2022-02-19 DIAGNOSIS — R109 Unspecified abdominal pain: Secondary | ICD-10-CM | POA: Diagnosis not present

## 2022-02-19 LAB — CBC WITH DIFFERENTIAL/PLATELET
Abs Immature Granulocytes: 0.05 10*3/uL (ref 0.00–0.07)
Basophils Absolute: 0 10*3/uL (ref 0.0–0.1)
Basophils Relative: 0 %
Eosinophils Absolute: 0.1 10*3/uL (ref 0.0–0.5)
Eosinophils Relative: 1 %
HCT: 37.2 % — ABNORMAL LOW (ref 39.0–52.0)
Hemoglobin: 11.8 g/dL — ABNORMAL LOW (ref 13.0–17.0)
Immature Granulocytes: 1 %
Lymphocytes Relative: 17 %
Lymphs Abs: 1.8 10*3/uL (ref 0.7–4.0)
MCH: 29.9 pg (ref 26.0–34.0)
MCHC: 31.7 g/dL (ref 30.0–36.0)
MCV: 94.2 fL (ref 80.0–100.0)
Monocytes Absolute: 0.8 10*3/uL (ref 0.1–1.0)
Monocytes Relative: 8 %
Neutro Abs: 7.9 10*3/uL — ABNORMAL HIGH (ref 1.7–7.7)
Neutrophils Relative %: 73 %
Platelets: 578 10*3/uL — ABNORMAL HIGH (ref 150–400)
RBC: 3.95 MIL/uL — ABNORMAL LOW (ref 4.22–5.81)
RDW: 12.4 % (ref 11.5–15.5)
WBC: 10.7 10*3/uL — ABNORMAL HIGH (ref 4.0–10.5)
nRBC: 0 % (ref 0.0–0.2)

## 2022-02-19 LAB — URINALYSIS, ROUTINE W REFLEX MICROSCOPIC
Glucose, UA: NEGATIVE mg/dL
Ketones, ur: 15 mg/dL — AB
Leukocytes,Ua: NEGATIVE
Nitrite: NEGATIVE
Protein, ur: 100 mg/dL — AB
Specific Gravity, Urine: >1.03 — ABNORMAL HIGH (ref 1.005–1.030)
pH: 6 (ref 5.0–8.0)

## 2022-02-19 LAB — COMPREHENSIVE METABOLIC PANEL
ALT: 14 U/L (ref 0–44)
AST: 13 U/L — ABNORMAL LOW (ref 15–41)
Albumin: 4 g/dL (ref 3.5–5.0)
Alkaline Phosphatase: 68 U/L (ref 38–126)
Anion gap: 12 (ref 5–15)
BUN: 13 mg/dL (ref 6–20)
CO2: 23 mmol/L (ref 22–32)
Calcium: 9.5 mg/dL (ref 8.9–10.3)
Chloride: 103 mmol/L (ref 98–111)
Creatinine, Ser: 0.81 mg/dL (ref 0.61–1.24)
GFR, Estimated: >60 mL/min (ref >60)
Glucose, Bld: 103 mg/dL — ABNORMAL HIGH (ref 70–99)
Potassium: 2.9 mmol/L — ABNORMAL LOW (ref 3.5–5.1)
Sodium: 138 mmol/L (ref 135–145)
Total Bilirubin: 1.5 mg/dL — ABNORMAL HIGH (ref 0.3–1.2)
Total Protein: 7.4 g/dL (ref 6.5–8.1)

## 2022-02-19 LAB — URINALYSIS, MICROSCOPIC (REFLEX)

## 2022-02-19 LAB — LIPASE, BLOOD: Lipase: 23 U/L (ref 11–51)

## 2022-02-19 NOTE — Discharge Instructions (Signed)
Please go to the nearest emergency department for further evaluation and management of symptoms as you appear to be very dehydrated today and would benefit from urgent blood work and possible IV fluids.
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
## 2022-02-19 NOTE — ED Notes (Signed)
This patient's family member was very upset asking who she needed to talk to in order to get the patient seen by internal medicine. This EMT informed the patient's family member that the patient would have to been seen by the ED Providers. She wanted to know what nurse she could speak with. This EMT informed the patient's family member that she could speak with the charge nurse but this EMT was unsure of how long it would be before the charge nurse would be able to come speak with her. The family asked to have the patient pulled out of the system because he could not wait any longer because he just had surgery and his doctor told him to come be seen.

## 2022-02-19 NOTE — ED Provider Triage Note (Signed)
Emergency Medicine Provider Triage Evaluation Note  Jonathan [name] , a 21 y.o. male  was evaluated in triage.  Pt complains of diarrhea.  Patient reports that he had cookout last Thursday since and has been having multiple episodes of diarrhea every day.  Patient denies any blood in stool or melena.  Patient reports minimal abdominal pain.  Patient is passing flatus without difficulty.  Patient had 1 episode of nausea and vomiting; denies any nausea at present.  Denies any fever, chills, abdominal distention, dysuria, hematuria, urinary urgency.  Patient reports that he had colostomy reversal on 02/08/2022.  Patient reports that he does have 1 wound to abdomen after his procedure but denies any associated erythema or purulent discharge.  Review of Systems  Positive: See above Negative: See above  Physical Exam  BP 135/73   Pulse 88   Temp 98.9 F (37.2 C) (Oral)   Resp (!) 26   SpO2 99%  Gen:   Awake, no distress   Resp:  Normal effort  MSK:   Moves extremities without difficulty  Other:  Abdomen soft, nondistended, minimal tenderness near surgical incision sites.  Wound to left lower abdomen with no surrounding erythema, warmth, or purulent discharge.  Minimal bloody discharge noted within wound.  Remainder patient's surgical wounds are clean, dry, and intact.  Medical Decision Making  Medically screening exam initiated at 1:54 PM.  Appropriate orders placed.  Jonathan [name] was informed that the remainder of the evaluation will be completed by another provider, this initial triage assessment does not replace that evaluation, and the importance of remaining in the ED until their evaluation is complete.     Haskel Schroeder, [place] 02/19/22 1356

## 2022-02-19 NOTE — ED Notes (Signed)
Patient discharged to ED.
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
## 2022-02-19 NOTE — ED Triage Notes (Signed)
Pt recently had colostomy removed and when he ate cookout on Thursday and ever since then he has been having [place] diarrhea with mild abd pain. No vomiting.

## 2022-02-20 ENCOUNTER — Telehealth: Payer: Self-pay | Admitting: Student

## 2022-02-20 MED ORDER — POTASSIUM CHLORIDE CRYS ER 20 MEQ PO TBCR: 40.0000 meq | EXTENDED_RELEASE_TABLET | Freq: Every day | ORAL | 0 refills | Status: DC

## 2022-02-20 NOTE — Telephone Encounter (Signed)
RTC to patient stated that he had labs done in the ER and would like to get results.  Patient was informed that he will need to speak to a doctor.  Dr. Lafonda Mosses was consulted.  Talked to patient.  Patient was again called and given an appointment to come in on Thursday morning for follow up at 8:45 AM.

## 2022-02-20 NOTE — Telephone Encounter (Signed)
Pt requesting a call back. Pt states he was instructed to go the ED yesterday due to having stomach pain and diarrhea. Pt states he is needing to know if he is ok because he left AMA. He left due to the wait.  Pt recently had surgery by  Dr Michaell Cowing at [facility].  Pt also wants to remove  the following medication off of his list because he does not need it oxyCODONE (OXY IR/ROXICODONE) 5 MG immediate release tablet   Pt wants to know if his labs are back.  Please call back.

## 2022-02-20 NOTE — Telephone Encounter (Signed)
Called patient to check in and discuss labs. He had ostomy takedown on 8/3, it went well.  Last week on Thursday, he had a big meal of cookout, then developed diarrhea after that. Had 20x loose stools daily on Friday & Saturday, started to feel a bit better on Sunday night, but he and his mother were concerned about it, so came to ER yesterday (Monday) to get it checked out.  In ER, afebrile, normal BP, normal Cr, but K was down to 2.9. Now, stools are forming up and he's eating and drinking well. No cramping, dizziness, lightheadedness  I'm calling in K 40 meq daily for him to start today He'll come in for appointment and repeat lab check with [facility] this Thursday or Friday morning  Will call if diarrhea comes back or sx get worse Will stay well hydrated
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
## 2022-02-22 ENCOUNTER — Ambulatory Visit (INDEPENDENT_AMBULATORY_CARE_PROVIDER_SITE_OTHER): Payer: 59 | Admitting: Student

## 2022-02-22 VITALS — BP 129/79 | HR 101 | Wt 141.1 lb

## 2022-02-22 DIAGNOSIS — M24562 Contracture, left knee: Secondary | ICD-10-CM

## 2022-02-22 DIAGNOSIS — E876 Hypokalemia: Secondary | ICD-10-CM

## 2022-02-22 DIAGNOSIS — R197 Diarrhea, unspecified: Secondary | ICD-10-CM | POA: Insufficient documentation

## 2022-02-22 DIAGNOSIS — G40909 Epilepsy, unspecified, not intractable, without status epilepticus: Secondary | ICD-10-CM

## 2022-02-22 MED ORDER — OXYCODONE HCL 10 MG PO TABS: 10.0000 mg | ORAL_TABLET | Freq: Three times a day (TID) | ORAL | 0 refills | Status: DC | PRN

## 2022-02-22 NOTE — Assessment & Plan Note (Signed)
Patient is followed by PM&R and recently underwent Botox injections for his left lower extremity. He reports improvement in his spasticity since receiving the Botox.  -Follow-up with PM&R (Dr Berline Chough) as scheduled on August 21

## 2022-02-22 NOTE — Assessment & Plan Note (Addendum)
Underwent colostomy reversal on August 3 and returned home on August 5 without medications.  Started to experience diarrhea on August 10 after eating at a cookout.  Presented to the ED on August 14 and was found to have hypokalemia and dehydration. He was given potassium to take at home. Since his ED visit, the patient reports complete resolution of his diarrhea. He describes some soreness from his surgery and flatulence.  Stools are no longer liquidy, they are [place] in color not black or red, and his urine is also normal in color.  Denies any abdominal pain, bloating, nausea, vomiting, diarrhea, constipation, melena, and hematochezia.  Exam revealed some mild abdominal tenderness to palpation in all 4 quadrants.  His ostomy site is nonerythematous, nonpurulent, and without discharge. He rinses the wound site with saline and changes the dressing daily. He has been intentionally eating light, specifically a lot of fruit and oatmeal and has been avoiding meats, especially cookout food. He has a follow-up visit with Dr. Michaell Cowing, the surgeon who performed his colostomy reversal, on 9-7.  -Order BMP and discontinue at-home potassium supplementation if potassium level is normal

## 2022-02-22 NOTE — Patient Instructions (Signed)
  Thank you, [name], for allowing [place] to provide your care today. Today we discussed . . .  > Diarrhea       - it is good to know that your diarrhea has resolved       - continue to eat healthily and change your wound dressings       - we have ordered a blood test and will call you with the results, continue to take your potassium until then > Pain Management       - we have refilled your oxycodone 10mg  for another month   I have ordered the following labs for you:   Lab Orders         BMP8+Anion Gap       Tests ordered today:  None   Referrals ordered today:   Referral Orders  No referral(s) requested today      I have ordered the following medication/changed the following medications:   Stop the following medications: Medications Discontinued During This Encounter  Medication Reason   oxyCODONE (OXY IR/ROXICODONE) 5 MG immediate release tablet No longer needed (for PRN medications)   Oxycodone HCl 10 MG TABS Reorder     Start the following medications: Meds ordered this encounter  Medications   Oxycodone HCl 10 MG TABS    Sig: Take 1 tablet (10 mg total) by mouth 3 (three) times daily as needed.    Dispense:  90 tablet    Refill:  0    Refill one month after last fill date      Follow up:  2 months     Remember:  Please continue to monitor your bowel movements and let know if your diarrhea returns. We will see you again in about two months!   Should you have any questions or concerns please call the internal medicine clinic at [phone].     [phone], MD [facility]

## 2022-02-22 NOTE — Assessment & Plan Note (Signed)
Has a history of seizures but has been well controlled on Keppra 500 mg twice daily.  Last seizure was in June 2022.  -Continue levetiracetam 500 mg twice per day

## 2022-02-23 LAB — BMP8+ANION GAP
Anion Gap: 18 mmol/L (ref 10.0–18.0)
BUN/Creatinine Ratio: 14 (ref 9–20)
BUN: 11 mg/dL (ref 6–20)
CO2: 23 mmol/L (ref 20–29)
Calcium: 9.8 mg/dL (ref 8.7–10.2)
Chloride: 98 mmol/L (ref 96–106)
Creatinine, Ser: 0.78 mg/dL (ref 0.76–1.27)
Glucose: 96 mg/dL (ref 70–99)
Potassium: 3.7 mmol/L (ref 3.5–5.2)
Sodium: 139 mmol/L (ref 134–144)
eGFR: 129 mL/min/{1.73_m2} (ref >59)

## 2022-02-26 ENCOUNTER — Other Ambulatory Visit: Payer: Self-pay | Admitting: Student

## 2022-02-26 ENCOUNTER — Encounter: Payer: 59 | Admitting: Physical Medicine and Rehabilitation

## 2022-02-26 NOTE — Telephone Encounter (Signed)
Pt requesting to use a new pharmacy  due to his new ins changes for the following medication:  acetaminophen (TYLENOL) 500 MG tablet  gabapentin (NEURONTIN) 400 MG capsule  levETIRAcetam (KEPPRA) 500 MG tablet Oxycodone HCl 10 MG TABS  potassium chloride SA (KLOR-CON M) 20 MEQ tablet    [facility] [address] [place], [place], [place] [address] Phone: [phone]

## 2022-02-27 MED ORDER — ACETAMINOPHEN 500 MG PO TABS: 1000.0000 mg | ORAL_TABLET | Freq: Four times a day (QID) | ORAL | 0 refills | Status: AC

## 2022-02-27 MED ORDER — GABAPENTIN 400 MG PO CAPS: 800.0000 mg | ORAL_CAPSULE | Freq: Three times a day (TID) | ORAL | 5 refills | Status: DC

## 2022-02-27 MED ORDER — LEVETIRACETAM 500 MG PO TABS
ORAL_TABLET | ORAL | 1 refills | Status: DC
Start: 2022-02-27 — End: 2022-04-03

## 2022-02-27 MED ORDER — OXYCODONE HCL 10 MG PO TABS: 10.0000 mg | ORAL_TABLET | Freq: Three times a day (TID) | ORAL | 0 refills | Status: DC | PRN

## 2022-02-27 MED ORDER — POTASSIUM CHLORIDE CRYS ER 20 MEQ PO TBCR: 40.0000 meq | EXTENDED_RELEASE_TABLET | Freq: Every day | ORAL | 0 refills | Status: DC

## 2022-02-27 NOTE — Addendum Note (Signed)
Addended by: Belva Agee on: 02/27/2022 06:24 PM   Modules accepted: Orders

## 2022-02-27 NOTE — Telephone Encounter (Signed)
PDMP reviewed and appropriate. Dr. Clearance Coots sent in a refill however do not believe he is able to write for controlled substances at this time. Will write prescription.

## 2022-02-27 NOTE — Progress Notes (Signed)
Internal Medicine Clinic Attending  I saw and evaluated the patient.  I personally confirmed the key portions of the history and exam documented by Dr. Harper and I reviewed pertinent patient test results.  The assessment, diagnosis, and plan were formulated together and I agree with the documentation in the resident's note.  

## 2022-03-05 ENCOUNTER — Other Ambulatory Visit: Payer: Self-pay | Admitting: Student

## 2022-03-05 NOTE — Telephone Encounter (Signed)
No [facility] seen.  Last appointment 02/22/2022.  No future appointments

## 2022-03-05 NOTE — Telephone Encounter (Signed)
Refill Request-Pt states he only rec'd a week's worth of the following medication which is not enough.  Pt states he now has a new ins and will require a Prior authorization to receive the rest..  Oxycodone HCl 10 MG TABS  [id number] [place], [place] - [address]

## 2022-03-06 ENCOUNTER — Telehealth: Payer: Self-pay | Admitting: *Deleted

## 2022-03-06 MED ORDER — OXYCODONE HCL 10 MG PO TABS: 10.0000 mg | ORAL_TABLET | Freq: Three times a day (TID) | ORAL | 0 refills | Status: DC | PRN

## 2022-03-06 NOTE — Telephone Encounter (Signed)
PDMP reviewed and appropriate. Did receive a few extra oxycodone's post operatively. Prior authorization approved for 6 months. Appreciate RN Herbin's assistance.

## 2022-03-06 NOTE — Telephone Encounter (Addendum)
Call to [facility] for PA for Oxycodone 10 mg tablets.  Patient without PA can only get a 7 day supply at at time.  Was given a 7 day supply on 03/03/2022.  PA was done over the phone.  Answered questions.  PA approved for 90 tablets every 30 days.  Starting 03/09/2022 through 09/06/2022.  If patient requires more than 90 per 30  days a new PA will need to be completed.   PA [id number].  Patient was previously only able to get a 7 day supply #21  without a PA.  Picked up 21 tablets on 03/03/2022.

## 2022-03-09 ENCOUNTER — Other Ambulatory Visit: Payer: Self-pay

## 2022-03-09 MED ORDER — OXYCODONE HCL 10 MG PO TABS: 10.0000 mg | ORAL_TABLET | Freq: Three times a day (TID) | ORAL | 0 refills | Status: DC | PRN

## 2022-03-09 NOTE — Telephone Encounter (Signed)
Will resend to appropriate pharmacy, did not fill prior script. PDMPT appropriate.

## 2022-03-09 NOTE — Telephone Encounter (Signed)
Patient called is picking up the rx from [facility] right now
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
## 2022-03-15 ENCOUNTER — Ambulatory Visit: Payer: Self-pay | Admitting: Surgery

## 2022-03-17 IMAGING — DX DG CHEST 1V PORT
1 series · 1 of 1 positions shown · non-contrast
Comparison: 10/19/2020

CLINICAL DATA: Check nasogastric tube.

EXAM:
PORTABLE CHEST 1 VIEW

[chest]
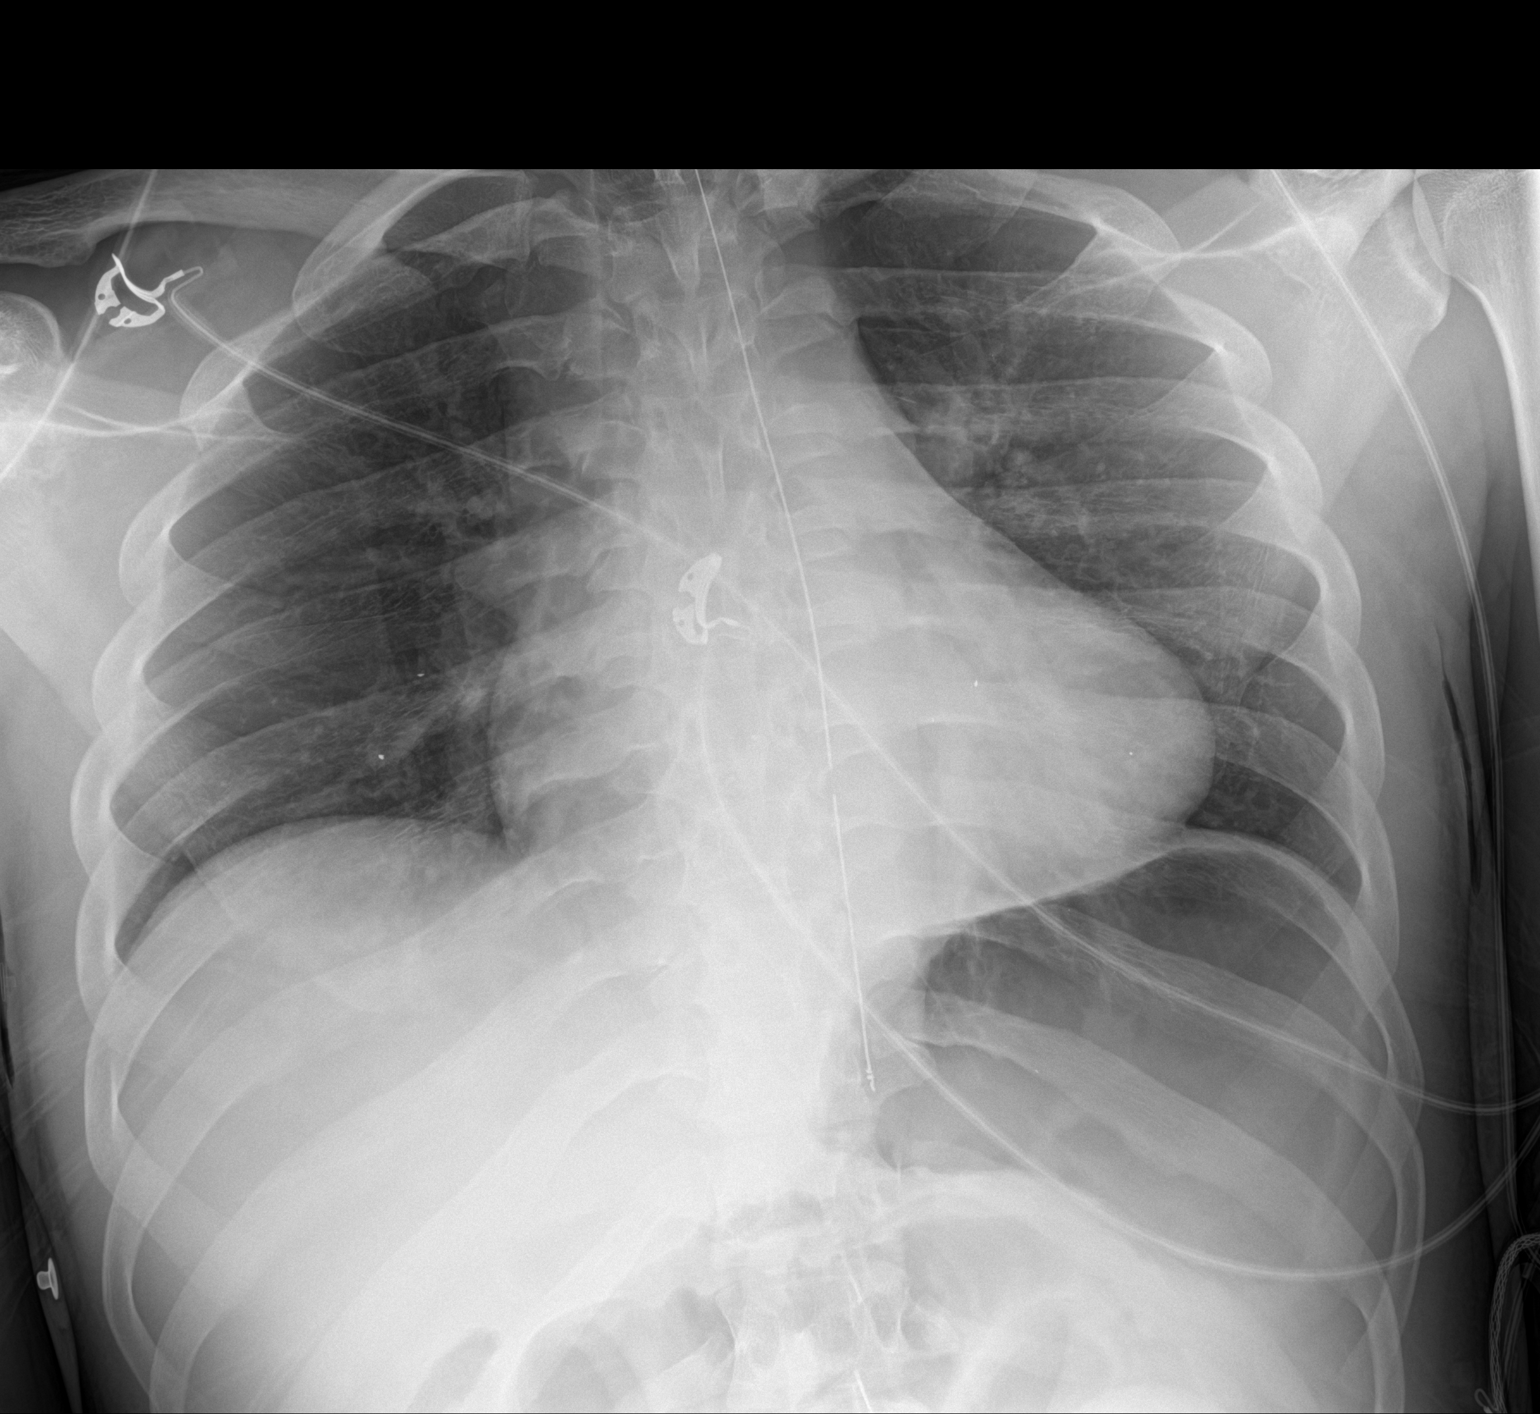

[1 of 1 positions shown; findings below may reference images not displayed]

FINDINGS: Nasogastric tube extends into the abdomen and the tip is in the
proximal stomach region. Side hole of the tube is near the distal
esophagus. Decreased lung volumes compared to the comparison
examination. Again noted are small punctate calcifications or
densities in both lungs. Heart size is within normal limits. Lucency
underneath the left hemidiaphragm is possibly related to a
gas-filled stomach.
IMPRESSION: 1. Tip of the nasogastric tube is in the proximal stomach region.
2. Decreased lung volumes.

## 2022-03-27 ENCOUNTER — Other Ambulatory Visit: Payer: Self-pay | Admitting: Physical Medicine and Rehabilitation

## 2022-04-02 ENCOUNTER — Other Ambulatory Visit: Payer: Self-pay | Admitting: *Deleted

## 2022-04-02 NOTE — Telephone Encounter (Signed)
MED REFILL REQUEST  Oxycodone HCl 10 MG TABS  [facility] [phone] - [place], [address] [facility] [place] Phone:  [phone]  Fax:  [phone]

## 2022-04-02 NOTE — Telephone Encounter (Signed)
It looks like two prescriptions were filled within 4 days of each other [facility]. Will need to clarify if both were filled. [facility]

## 2022-04-03 ENCOUNTER — Other Ambulatory Visit: Payer: Self-pay | Admitting: Student

## 2022-04-03 NOTE — Telephone Encounter (Signed)
Gabapentin #180 with 5 refills sent to [facility] on 03/28/22. [facility].

## 2022-04-03 NOTE — Telephone Encounter (Signed)
gabapentin (NEURONTIN) 400 MG capsule  levETIRAcetam (KEPPRA) 500 MG tablet, REFILL REQUEST @ [facility] [phone] - [place], [place] AT [address].

## 2022-04-04 ENCOUNTER — Other Ambulatory Visit: Payer: Self-pay

## 2022-04-04 MED ORDER — LEVETIRACETAM 500 MG PO TABS
ORAL_TABLET | ORAL | 1 refills | Status: DC
Start: 2022-04-04 — End: 2022-08-28

## 2022-04-04 MED ORDER — GABAPENTIN 400 MG PO CAPS
ORAL_CAPSULE | ORAL | 5 refills | Status: DC
Start: 2022-04-04 — End: 2022-06-06

## 2022-05-02 ENCOUNTER — Other Ambulatory Visit: Payer: Self-pay | Admitting: Student

## 2022-05-02 MED ORDER — OXYCODONE HCL 10 MG PO TABS: 10.0000 mg | ORAL_TABLET | Freq: Three times a day (TID) | ORAL | 0 refills | Status: DC | PRN

## 2022-05-02 NOTE — Telephone Encounter (Signed)
Refill Request  Oxycodone HCl 10 MG TABS   [id number] [place], [place]

## 2022-05-29 ENCOUNTER — Ambulatory Visit (INDEPENDENT_AMBULATORY_CARE_PROVIDER_SITE_OTHER): Payer: 59 | Admitting: Student

## 2022-05-29 ENCOUNTER — Other Ambulatory Visit: Payer: Self-pay

## 2022-05-29 ENCOUNTER — Encounter: Payer: Self-pay | Admitting: Student

## 2022-05-29 VITALS — BP 128/64 | HR 91 | Temp 98.5°F | Resp 24 | Ht 72.0 in | Wt 154.3 lb

## 2022-05-29 DIAGNOSIS — S3669XA Other injury of rectum, initial encounter: Secondary | ICD-10-CM

## 2022-05-29 DIAGNOSIS — M24562 Contracture, left knee: Secondary | ICD-10-CM

## 2022-05-29 MED ORDER — DULOXETINE HCL 30 MG PO CPEP: 30.0000 mg | ORAL_CAPSULE | Freq: Every day | ORAL | 0 refills | Status: DC
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
## 2022-05-29 NOTE — Patient Instructions (Signed)
Thank you so much for coming to the clinic today! I have sent in the referral to Dr. Berline Chough, and have also sent a month supply of a medication called Cymbalta to help with the soreness. At the end of the month if there's not any more pain you don't need to continue it, but if you do feel like you need feel free to give [place] a call or make an appointment!    If you have any questions please feel free to the call the clinic at anytime at [phone]. It was a pleasure seeing you!  Best, Dr. Thomasene Ripple

## 2022-05-30 NOTE — Addendum Note (Signed)
Addended by: Earl Lagos on: 05/30/2022 10:40 AM   Modules accepted: Level of Service

## 2022-05-30 NOTE — Progress Notes (Signed)
Internal Medicine Clinic Attending  I saw and evaluated the patient.  I personally confirmed the key portions of the history and exam documented by Dr. Nooruddin and I reviewed pertinent patient test results.  The assessment, diagnosis, and plan were formulated together and I agree with the documentation in the resident's note.  

## 2022-06-06 ENCOUNTER — Other Ambulatory Visit: Payer: Self-pay

## 2022-06-06 NOTE — Telephone Encounter (Signed)
No Tox[employer]ure.  Last appointment was 05/29/2022.  No future appointments.

## 2022-06-06 NOTE — Telephone Encounter (Signed)
Open error 

## 2022-06-07 MED ORDER — OXYCODONE HCL 10 MG PO TABS: 10.0000 mg | ORAL_TABLET | Freq: Three times a day (TID) | ORAL | 0 refills | Status: DC | PRN

## 2022-06-07 MED ORDER — GABAPENTIN 400 MG PO CAPS
ORAL_CAPSULE | ORAL | 5 refills | Status: DC
Start: 2022-06-07 — End: 2022-11-08

## 2022-06-12 ENCOUNTER — Telehealth: Payer: Self-pay

## 2022-06-12 NOTE — Telephone Encounter (Signed)
Prior Authorization for patient (oxycodone) came through on cover my meds was submitted with last office notes awaiting approval or denial 

## 2022-06-12 NOTE — Telephone Encounter (Signed)
Decision:Approved Joaquim Lai Key: [id number] - PA Case ID: [id number] - Rx #: [id number] Need help? Call us at [phone] Outcome Approvedtoday CaseId:[id number];Status:Approved;Review Type:Prior Auth;Coverage Start Date:06/12/2022;Coverage End Date:06/12/2023; Drug oxyCODONE HCl 10MG  tablets Form [employer] Electronic PA Form (2017 NCPDP) Original Claim Info 75 MD CALL HELP DESK

## 2022-06-19 ENCOUNTER — Encounter: Payer: Self-pay | Admitting: Physical Medicine and Rehabilitation

## 2022-06-20 ENCOUNTER — Other Ambulatory Visit: Payer: Self-pay | Admitting: Student

## 2022-07-10 ENCOUNTER — Other Ambulatory Visit: Payer: Self-pay | Admitting: Student

## 2022-07-10 MED ORDER — OXYCODONE HCL 10 MG PO TABS: 10.0000 mg | ORAL_TABLET | Freq: Three times a day (TID) | ORAL | 0 refills | Status: DC | PRN

## 2022-07-10 NOTE — Telephone Encounter (Signed)
Last rx written 06/07/22 Last OV 05/29/22 with Dr Nooruddin Next OV - has not been scheduled. UDS 01/12/21.

## 2022-07-10 NOTE — Telephone Encounter (Signed)
Oxycodone HCl 10 MG TABS   [id number] - [place], [place] - [place]

## 2022-08-09 ENCOUNTER — Other Ambulatory Visit: Payer: Self-pay

## 2022-08-09 MED ORDER — OXYCODONE HCL 10 MG PO TABS: 10.0000 mg | ORAL_TABLET | Freq: Three times a day (TID) | ORAL | 0 refills | Status: DC | PRN

## 2022-08-13 ENCOUNTER — Ambulatory Visit (INDEPENDENT_AMBULATORY_CARE_PROVIDER_SITE_OTHER): Payer: Medicaid Other | Admitting: Student

## 2022-08-13 ENCOUNTER — Encounter: Payer: Self-pay | Admitting: Student

## 2022-08-13 VITALS — BP 134/79 | HR 86 | Temp 98.5°F | Ht 72.0 in | Wt 161.6 lb

## 2022-08-13 DIAGNOSIS — M24562 Contracture, left knee: Secondary | ICD-10-CM | POA: Diagnosis not present

## 2022-08-13 MED ORDER — DULOXETINE HCL 30 MG PO CPEP: 30.0000 mg | ORAL_CAPSULE | Freq: Two times a day (BID) | ORAL | 3 refills | Status: DC

## 2022-08-13 NOTE — Patient Instructions (Signed)
Thank you, [name] for allowing [place] to provide your care today. Today we discussed your left leg contracture. I am glad you are doing well. I will increase your Duloxetine since you have been able to tolerate it.   I have place a referrals to PT  I have ordered the following medication/changed the following medications:  Increase Duloxetine to 30 mg twice daily  My Chart Access: [email or web address]  Please follow-up in 3 months  Please make sure to arrive 15 minutes prior to your next appointment. If you arrive late, you may be asked to reschedule.    We look forward to seeing you next time. Please call our clinic at [phone] if you have any questions or concerns. The best time to call is Monday-Friday from 9am-4pm, but there is someone available 24/7. If after hours or the weekend, call the main hospital number and ask for the Internal Medicine Resident On-Call. If you need medication refills, please notify your pharmacy one week in advance and they will send [place] a request.   Thank you for letting us take part in your care. Wishing you the best!  Lacinda Axon, MD 08/13/2022, 11:13 AM IM Resident, PGY-3 Oswaldo Milian 41:10

## 2022-08-13 NOTE — Assessment & Plan Note (Signed)
  Patient here for follow-up after initiating duloxetine during last office visit. Patient reports he took the duloxetine for a few days then stop because of GI symptoms.  However he started taking it with breakfast and has been tolerating it for the past 3 weeks. He has not noticed a significant change in his pain. He is scheduled to follow-up with pain medicine for Botox injection in March. States he was not able to do physical therapy last year due to lack of insurance. Patient now has Medicaid and interested in and working with PT to improve mobility and muscle tone. Still unable to extend his left leg completely but has made significant progress in the last few months. Ambulates with left leg on a scooter.   Plan: -Increase duloxetine to 30 mg twice daily -Continue gabapentin 800 mg 3 times daily -Continue oxycodone 10 mg 3 times daily prn for pain -Continue OTC Tylenol as needed for pain -Referral to PT -Follow-up with pain medicine for Botox injection as scheduled on March 8th.  -Follow-up in 3 months
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
## 2022-08-15 NOTE — Progress Notes (Signed)
Internal Medicine Clinic Attending  Case discussed with the resident at the time of the visit.  We reviewed the resident's history and exam and pertinent patient test results.  I agree with the assessment, diagnosis, and plan of care documented in the resident's note.  

## 2022-08-28 ENCOUNTER — Other Ambulatory Visit: Payer: Self-pay | Admitting: Student

## 2022-08-28 ENCOUNTER — Ambulatory Visit: Payer: Medicaid Other | Attending: Internal Medicine

## 2022-08-28 DIAGNOSIS — R2681 Unsteadiness on feet: Secondary | ICD-10-CM

## 2022-08-28 DIAGNOSIS — M6281 Muscle weakness (generalized): Secondary | ICD-10-CM | POA: Diagnosis present

## 2022-08-28 DIAGNOSIS — R262 Difficulty in walking, not elsewhere classified: Secondary | ICD-10-CM

## 2022-08-28 DIAGNOSIS — R293 Abnormal posture: Secondary | ICD-10-CM | POA: Diagnosis present

## 2022-08-28 DIAGNOSIS — M24562 Contracture, left knee: Secondary | ICD-10-CM | POA: Insufficient documentation
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
## 2022-09-05 ENCOUNTER — Other Ambulatory Visit: Payer: Self-pay

## 2022-09-06 MED ORDER — OXYCODONE HCL 10 MG PO TABS: 10.0000 mg | ORAL_TABLET | Freq: Three times a day (TID) | ORAL | 0 refills | Status: DC | PRN

## 2022-09-14 ENCOUNTER — Encounter
Payer: Medicaid Other | Attending: Physical Medicine and Rehabilitation | Admitting: Physical Medicine and Rehabilitation

## 2022-09-14 ENCOUNTER — Encounter: Payer: Self-pay | Admitting: Physical Medicine and Rehabilitation

## 2022-09-14 VITALS — BP 124/74 | HR 72 | Ht 72.0 in | Wt 157.0 lb

## 2022-09-14 DIAGNOSIS — M792 Neuralgia and neuritis, unspecified: Secondary | ICD-10-CM | POA: Insufficient documentation

## 2022-09-14 DIAGNOSIS — M21372 Foot drop, left foot: Secondary | ICD-10-CM | POA: Diagnosis not present

## 2022-09-14 DIAGNOSIS — M24562 Contracture, left knee: Secondary | ICD-10-CM | POA: Diagnosis not present
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
## 2022-09-17 ENCOUNTER — Ambulatory Visit: Payer: Medicaid Other | Admitting: Physical Therapy

## 2022-09-20 ENCOUNTER — Ambulatory Visit: Payer: Medicaid Other

## 2022-09-24 ENCOUNTER — Ambulatory Visit: Payer: Medicaid Other

## 2022-09-27 ENCOUNTER — Ambulatory Visit: Payer: Medicaid Other | Admitting: Physical Therapy

## 2022-10-01 ENCOUNTER — Ambulatory Visit: Payer: Medicaid Other

## 2022-10-02 ENCOUNTER — Other Ambulatory Visit: Payer: Self-pay

## 2022-10-02 MED ORDER — OXYCODONE HCL 10 MG PO TABS: 10.0000 mg | ORAL_TABLET | Freq: Three times a day (TID) | ORAL | 0 refills | Status: DC | PRN

## 2022-10-04 ENCOUNTER — Ambulatory Visit: Payer: Medicaid Other | Admitting: Physical Therapy

## 2022-10-04 ENCOUNTER — Other Ambulatory Visit: Payer: Self-pay

## 2022-10-08 ENCOUNTER — Encounter
Payer: Medicaid Other | Attending: Physical Medicine and Rehabilitation | Admitting: Physical Medicine and Rehabilitation

## 2022-10-08 ENCOUNTER — Ambulatory Visit: Payer: Medicaid Other | Admitting: Physical Therapy

## 2022-10-08 ENCOUNTER — Encounter: Payer: Self-pay | Admitting: Physical Medicine and Rehabilitation

## 2022-10-08 VITALS — BP 132/75 | HR 88 | Ht 72.0 in | Wt 158.0 lb

## 2022-10-08 DIAGNOSIS — M24562 Contracture, left knee: Secondary | ICD-10-CM

## 2022-10-08 DIAGNOSIS — G8310 Monoplegia of lower limb affecting unspecified side: Secondary | ICD-10-CM | POA: Diagnosis present

## 2022-10-08 MED ORDER — ONABOTULINUMTOXINA 100 UNITS IJ SOLR
400.0000 [IU] | Freq: Once | INTRAMUSCULAR | Status: AC
  Administered 2022-10-08: 400 [IU] via INTRAMUSCULAR

## 2022-10-08 NOTE — Patient Instructions (Signed)
Follow up with Dr. Dagoberto Ligas for general visits and with me for botox as needed

## 2022-10-08 NOTE — Progress Notes (Signed)
  Botolulinum Toxin Injection: [x ] BOTOX (onabotulinumtoxinA) [_] DYSPORT (abobotulinumtoxinA)  Goals with treatment: [ x] Decrease spasms/ abnormal movements [ x] Improve Active / Passive ROM [ x] Improve ADLs [x ] Improve functional mobility [ x] Improve gait mechanics [ ]  Improve positioning/posture [x ] Prevent contracture  [ ]  Prevent joint destruction [ ]  Prevent skin breakdown [ ]  Decrease caregiver burden [ ]  Improve hygiene [x ] Improve Pain  MEDICATION:  ONAbotulinum toxin. 400 Units  NaCl 800 ccs  CONSENT: Obtained in writing followed by time-out per [facility] policy. Consent uploaded to chart.  Benefits discussed included, but were not limited to, decreased muscle tightness and spasticity, increased joint range of motion, improved limb positioning and facilitation of hygiene and nursing care.   Risks discussed included, but were not limited to, pain and discomfort, bleeding, bruising, excessive weakness, venous thrombosis, muscle atrophy, and distant spread of toxin which could include generalized muscle weakness, diplopia, blurred vision, ptosis, dysphagia, dysphonia, dysarthria, urinary incontinence and breathing difficulties. These symptoms have been reported hours to weeks after injection. Swallowing and breathing difficulties may be life threatening, and there have been reports of death. Patient/Family member/Guardian/Caregiver have been offered botulinum toxin informational material upon initial consultation and this information has been continually available. All questions answered to patient/family member/guardian/ caregiver satisfaction. They would like to proceed with procedure. There are no noted contraindications to procedure.  PROCEDURE Arly.Keller ] Without Ultrasound: Patient was placed in a position with the appropriate muscles exposed, located and identified. The skin was cleaned with ChloraPrep and ethyl chloride was sprayed for topical anesthetic. Using combination EMG  amplification and electrical stimulation, the following muscles were identified using anatomical landmarks described by Perotto, et al (1994) and injected following aspiration to ensure blood vessels were avoided.  [_ ] With Ultrasound: Patient was placed in a position with the appropriate muscles exposed, located and identified. The skin was cleaned with ChloraPrep and ethyl chloride was sprayed for topical anesthetic. Using combination Ultrasound guidance for anatomical guidance, avoidance of significant vasculature, to decrease the risk of hematoma formation and ensure botox was placed in the correct location; EMG amplification and electrical stimulation, the following muscles were identified and injected following aspiration to ensure blood vessels were avoided. A _linear transducer was used during the procedure. The botulinum toxin was visualized entering the appropriate musculature.   MUSCLE: Units /Sites Gastrosoleus: 50 U medial x1 site 50U Lateral gastroc x1 site  - 2+ MUAP Hamstrings: 200 U medial x3 sites 2+ MUAP , 100U lateral x2 sites - 1+ MUAP   400 units were injected without difficulty. No complications were encountered. The patient tolerated the procedure well. Wasted 0

## 2022-10-11 ENCOUNTER — Ambulatory Visit: Payer: Medicaid Other

## 2022-10-15 ENCOUNTER — Ambulatory Visit: Payer: Medicaid Other | Attending: Internal Medicine | Admitting: Physical Therapy

## 2022-10-15 ENCOUNTER — Encounter: Payer: Self-pay | Admitting: Physical Therapy

## 2022-10-15 DIAGNOSIS — M6281 Muscle weakness (generalized): Secondary | ICD-10-CM | POA: Diagnosis present

## 2022-10-15 DIAGNOSIS — R2681 Unsteadiness on feet: Secondary | ICD-10-CM | POA: Insufficient documentation

## 2022-10-15 DIAGNOSIS — R262 Difficulty in walking, not elsewhere classified: Secondary | ICD-10-CM | POA: Insufficient documentation

## 2022-10-15 DIAGNOSIS — R293 Abnormal posture: Secondary | ICD-10-CM | POA: Insufficient documentation
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
## 2022-10-24 ENCOUNTER — Ambulatory Visit: Payer: Medicaid Other

## 2022-10-24 DIAGNOSIS — M6281 Muscle weakness (generalized): Secondary | ICD-10-CM | POA: Diagnosis not present

## 2022-10-24 DIAGNOSIS — R293 Abnormal posture: Secondary | ICD-10-CM

## 2022-10-24 DIAGNOSIS — R2681 Unsteadiness on feet: Secondary | ICD-10-CM

## 2022-10-24 DIAGNOSIS — R262 Difficulty in walking, not elsewhere classified: Secondary | ICD-10-CM
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
## 2022-10-26 ENCOUNTER — Ambulatory Visit: Payer: Medicaid Other

## 2022-10-26 DIAGNOSIS — M6281 Muscle weakness (generalized): Secondary | ICD-10-CM

## 2022-10-26 DIAGNOSIS — R293 Abnormal posture: Secondary | ICD-10-CM

## 2022-10-26 DIAGNOSIS — R262 Difficulty in walking, not elsewhere classified: Secondary | ICD-10-CM

## 2022-10-26 DIAGNOSIS — R2681 Unsteadiness on feet: Secondary | ICD-10-CM
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
## 2022-10-29 ENCOUNTER — Other Ambulatory Visit: Payer: Self-pay | Admitting: Student

## 2022-10-31 ENCOUNTER — Ambulatory Visit: Payer: Medicaid Other

## 2022-10-31 ENCOUNTER — Other Ambulatory Visit: Payer: Self-pay

## 2022-10-31 DIAGNOSIS — R2681 Unsteadiness on feet: Secondary | ICD-10-CM

## 2022-10-31 DIAGNOSIS — R262 Difficulty in walking, not elsewhere classified: Secondary | ICD-10-CM

## 2022-10-31 DIAGNOSIS — R293 Abnormal posture: Secondary | ICD-10-CM

## 2022-10-31 DIAGNOSIS — M6281 Muscle weakness (generalized): Secondary | ICD-10-CM

## 2022-10-31 MED ORDER — OXYCODONE HCL 10 MG PO TABS: 10.0000 mg | ORAL_TABLET | Freq: Three times a day (TID) | ORAL | 0 refills | Status: DC | PRN

## 2022-10-31 NOTE — Telephone Encounter (Signed)
PDMP appropriate. Last fill 3/27
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
## 2022-11-01 MED ORDER — OXYCODONE HCL 10 MG PO TABS: 10.0000 mg | ORAL_TABLET | Freq: Three times a day (TID) | ORAL | 0 refills | Status: DC | PRN
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
## 2022-11-01 NOTE — Addendum Note (Signed)
Addended by: Fredderick Severance on: 11/01/2022 11:10 AM   Modules accepted: Orders

## 2022-11-01 NOTE — Addendum Note (Signed)
Addended by: Derrek Monaco on: 11/01/2022 11:24 AM   Modules accepted: Orders

## 2022-11-01 NOTE — Telephone Encounter (Signed)
Incoming fax from pharmacy Medication:oxycodone Message to prescriber: "system cannot find prescriber per the [facility] number please resubmit w/ another prescriber. Thank You."

## 2022-11-02 ENCOUNTER — Ambulatory Visit: Payer: Medicaid Other

## 2022-11-02 DIAGNOSIS — R2681 Unsteadiness on feet: Secondary | ICD-10-CM

## 2022-11-02 DIAGNOSIS — R293 Abnormal posture: Secondary | ICD-10-CM

## 2022-11-02 DIAGNOSIS — M6281 Muscle weakness (generalized): Secondary | ICD-10-CM

## 2022-11-02 DIAGNOSIS — R262 Difficulty in walking, not elsewhere classified: Secondary | ICD-10-CM
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
## 2022-11-07 ENCOUNTER — Ambulatory Visit: Payer: Medicaid Other | Attending: Internal Medicine

## 2022-11-07 DIAGNOSIS — M6281 Muscle weakness (generalized): Secondary | ICD-10-CM | POA: Diagnosis present

## 2022-11-07 DIAGNOSIS — R2681 Unsteadiness on feet: Secondary | ICD-10-CM | POA: Diagnosis present

## 2022-11-07 DIAGNOSIS — R293 Abnormal posture: Secondary | ICD-10-CM | POA: Insufficient documentation

## 2022-11-07 DIAGNOSIS — R262 Difficulty in walking, not elsewhere classified: Secondary | ICD-10-CM | POA: Diagnosis present
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
## 2022-11-08 ENCOUNTER — Ambulatory Visit (INDEPENDENT_AMBULATORY_CARE_PROVIDER_SITE_OTHER): Payer: Medicaid Other | Admitting: Student

## 2022-11-08 ENCOUNTER — Encounter: Payer: Self-pay | Admitting: Student

## 2022-11-08 VITALS — BP 119/74 | HR 109 | Temp 98.4°F | Ht 72.0 in | Wt 153.0 lb

## 2022-11-08 DIAGNOSIS — M24562 Contracture, left knee: Secondary | ICD-10-CM | POA: Diagnosis not present

## 2022-11-08 MED ORDER — GABAPENTIN 400 MG PO CAPS: ORAL_CAPSULE | ORAL | 5 refills | Status: DC
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
## 2022-11-08 NOTE — Patient Instructions (Signed)
Thank you, Jonathan [name] for allowing [place] to provide your care today. Today we discussed your left lower extremity weakness.  I have sent refills of your gabapentin to your pharmacy. Continue working with physical therapy to help improve your strength and control.  My Chart Access: [email or web address]  Please follow-up in 6 months  Please make sure to arrive 15 minutes prior to your next appointment. If you arrive late, you may be asked to reschedule.    We look forward to seeing you next time. Please call our clinic at [phone] if you have any questions or concerns. The best time to call is Monday-Friday from 9am-4pm, but there is someone available 24/7. If after hours or the weekend, call the main hospital number and ask for the Internal Medicine Resident On-Call. If you need medication refills, please notify your pharmacy one week in advance and they will send [place] a request.   Thank you for letting us take part in your care. Wishing you the best!  Steffanie Rainwater, MD 11/08/2022, 2:38 PM IM Resident, PGY-3 Duwayne Heck 41:10

## 2022-11-09 ENCOUNTER — Encounter: Payer: Self-pay | Admitting: Student

## 2022-11-09 ENCOUNTER — Telehealth: Payer: Self-pay

## 2022-11-09 ENCOUNTER — Ambulatory Visit: Payer: Medicaid Other

## 2022-11-09 DIAGNOSIS — R293 Abnormal posture: Secondary | ICD-10-CM

## 2022-11-09 DIAGNOSIS — R2681 Unsteadiness on feet: Secondary | ICD-10-CM

## 2022-11-09 DIAGNOSIS — M6281 Muscle weakness (generalized): Secondary | ICD-10-CM | POA: Diagnosis not present

## 2022-11-09 DIAGNOSIS — R262 Difficulty in walking, not elsewhere classified: Secondary | ICD-10-CM
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
## 2022-11-09 NOTE — Assessment & Plan Note (Signed)
Patient here to follow-up on his left lower extremity weakness. Since his last visit 3 months ago, he has started physical therapy twice a week and has found it helpful. He still moves around with his left leg on a scooter but has been able to ambulate briefly during therapy with a cane. He continues to follow-up with PM&R and received Botox injections last month. His LLE remains weak and contracted but he has noticed improvement over the last few weeks. His girlfriend and parents have been very supportive. He has no new complaints. He needs refill on his gabapentin.   Plan: -Refill gabapentin 800 mg 3 times daily -Continue duloxetine to 30 mg twice daily -Continue oxycodone 10 mg 3 times daily prn for pain -Continue OTC Tylenol as needed for pain -Continue PT -Follow up with pain medicine as needed

## 2022-11-09 NOTE — Progress Notes (Signed)
Internal Medicine Clinic Attending  Case discussed with Dr. Amponsah  At the time of the visit.  We reviewed the resident's history and exam and pertinent patient test results.  I agree with the assessment, diagnosis, and plan of care documented in the resident's note.  

## 2022-11-09 NOTE — Telephone Encounter (Signed)
Dr. Berline Chough, Jonathan [name] is being treated by physical therapy for L LE flexion contracture/weakness.  He will benefit from use of a L custom AFO in order to improve safety with functional mobility.    If you agree, please submit request in EPIC under MD Order, Other Orders (list L custom AFO AFO in comments) or fax to Jonathan [name], Jonathan [name] at [phone].   Thank you, Jonathan [name], PT, DPT, [name] [address] [place], [place]  [address] Phone:  [phone] Fax:  [phone]

## 2022-11-13 NOTE — Telephone Encounter (Signed)
Order with face sheet and notes faxed to [facility] for the L AFO, and faxed to [facility] as well.

## 2022-11-14 ENCOUNTER — Ambulatory Visit: Payer: Medicaid Other

## 2022-11-14 DIAGNOSIS — R262 Difficulty in walking, not elsewhere classified: Secondary | ICD-10-CM

## 2022-11-14 DIAGNOSIS — M6281 Muscle weakness (generalized): Secondary | ICD-10-CM | POA: Diagnosis not present

## 2022-11-14 DIAGNOSIS — R293 Abnormal posture: Secondary | ICD-10-CM

## 2022-11-14 DIAGNOSIS — R2681 Unsteadiness on feet: Secondary | ICD-10-CM
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
## 2022-11-16 ENCOUNTER — Ambulatory Visit: Payer: Medicaid Other

## 2022-11-28 ENCOUNTER — Other Ambulatory Visit: Payer: Self-pay

## 2022-11-30 ENCOUNTER — Other Ambulatory Visit: Payer: Self-pay | Admitting: Student

## 2022-11-30 ENCOUNTER — Telehealth: Payer: Self-pay | Admitting: Student

## 2022-11-30 MED ORDER — OXYCODONE HCL 10 MG PO TABS: 10.0000 mg | ORAL_TABLET | Freq: Three times a day (TID) | ORAL | 0 refills | Status: DC | PRN

## 2022-11-30 NOTE — Telephone Encounter (Signed)
Pt states he prev called on for the following medication but it was refused because it was too soon.  Pt requesting his refill again   Oxycodone HCl 10 MG TABS      [id number] - [place], [place] - [address] (Ph: [phone])

## 2022-11-30 NOTE — Telephone Encounter (Signed)
Reviewed the PDMP and it is appropriate to refill. Patient needs to make an appointment to come in. I have refilled this medication for him. I will sned front desk message to ensure that he has an appointment to come in. He does have a signed pain contract executed by Dr. Jodelle Red in 12/07/2020 in the internal medicine clinic.

## 2022-11-30 NOTE — Telephone Encounter (Signed)
Last rx written - 11/05/22. Last OV - 11/08/22. Next OV - has not been scheduled. TOX - has not been done.

## 2022-12-05 ENCOUNTER — Ambulatory Visit: Payer: Medicaid Other

## 2022-12-10 ENCOUNTER — Encounter
Payer: Medicaid Other | Attending: Physical Medicine and Rehabilitation | Admitting: Physical Medicine and Rehabilitation

## 2022-12-10 ENCOUNTER — Encounter: Payer: Self-pay | Admitting: Student

## 2022-12-10 DIAGNOSIS — G8310 Monoplegia of lower limb affecting unspecified side: Secondary | ICD-10-CM | POA: Insufficient documentation

## 2022-12-13 ENCOUNTER — Encounter: Payer: Self-pay | Admitting: Physical Therapy

## 2022-12-13 ENCOUNTER — Ambulatory Visit: Payer: Medicaid Other | Attending: Internal Medicine | Admitting: Physical Therapy

## 2022-12-13 VITALS — BP 113/64

## 2022-12-13 DIAGNOSIS — M25652 Stiffness of left hip, not elsewhere classified: Secondary | ICD-10-CM

## 2022-12-13 DIAGNOSIS — R293 Abnormal posture: Secondary | ICD-10-CM

## 2022-12-13 DIAGNOSIS — M6281 Muscle weakness (generalized): Secondary | ICD-10-CM

## 2022-12-13 DIAGNOSIS — R2681 Unsteadiness on feet: Secondary | ICD-10-CM

## 2022-12-13 DIAGNOSIS — M25662 Stiffness of left knee, not elsewhere classified: Secondary | ICD-10-CM

## 2022-12-13 DIAGNOSIS — R262 Difficulty in walking, not elsewhere classified: Secondary | ICD-10-CM
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
## 2022-12-17 ENCOUNTER — Encounter: Payer: Self-pay | Admitting: Physical Therapy

## 2022-12-17 ENCOUNTER — Ambulatory Visit: Payer: Medicaid Other | Admitting: Physical Therapy

## 2022-12-17 VITALS — BP 122/71 | HR 81

## 2022-12-17 DIAGNOSIS — M6281 Muscle weakness (generalized): Secondary | ICD-10-CM | POA: Diagnosis not present

## 2022-12-17 DIAGNOSIS — R2681 Unsteadiness on feet: Secondary | ICD-10-CM

## 2022-12-17 DIAGNOSIS — M25652 Stiffness of left hip, not elsewhere classified: Secondary | ICD-10-CM

## 2022-12-17 DIAGNOSIS — R293 Abnormal posture: Secondary | ICD-10-CM

## 2022-12-17 DIAGNOSIS — M25662 Stiffness of left knee, not elsewhere classified: Secondary | ICD-10-CM

## 2022-12-17 DIAGNOSIS — R262 Difficulty in walking, not elsewhere classified: Secondary | ICD-10-CM
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
## 2022-12-24 ENCOUNTER — Ambulatory Visit: Payer: Medicaid Other | Admitting: Physical Therapy

## 2022-12-30 ENCOUNTER — Telehealth: Payer: Self-pay | Admitting: Internal Medicine

## 2022-12-30 MED ORDER — OXYCODONE HCL 10 MG PO TABS: 10.0000 mg | ORAL_TABLET | Freq: Three times a day (TID) | ORAL | 0 refills | Status: DC | PRN

## 2022-12-30 NOTE — Telephone Encounter (Signed)
Paged by operator as patient is requesting return phone call. I contacted the patient who is requesting a refill of his oxycodone to be sent in as it needs to be filled tomorrow. The remainder of his medications can be done on the [facility] app. PDMP reviewed and appropriate. Last OV with Dr. Kirke Corin 11/08/2022 recommending 6 month follow-up. Discussed scheduling next f/u visit with the patient who voices understanding. Refill for oxycodone 10 mg TID PRN sent to [facility] on [address].  Champ Mungo, DO Internal Medicine PGY-2

## 2022-12-31 ENCOUNTER — Ambulatory Visit: Payer: Medicaid Other | Admitting: Physical Therapy

## 2022-12-31 DIAGNOSIS — R293 Abnormal posture: Secondary | ICD-10-CM

## 2022-12-31 DIAGNOSIS — M25662 Stiffness of left knee, not elsewhere classified: Secondary | ICD-10-CM

## 2022-12-31 DIAGNOSIS — M6281 Muscle weakness (generalized): Secondary | ICD-10-CM

## 2022-12-31 DIAGNOSIS — R262 Difficulty in walking, not elsewhere classified: Secondary | ICD-10-CM

## 2022-12-31 DIAGNOSIS — M25652 Stiffness of left hip, not elsewhere classified: Secondary | ICD-10-CM

## 2022-12-31 DIAGNOSIS — R2681 Unsteadiness on feet: Secondary | ICD-10-CM
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
## 2023-01-07 ENCOUNTER — Ambulatory Visit: Payer: Medicaid Other | Attending: Internal Medicine | Admitting: Physical Therapy

## 2023-01-07 ENCOUNTER — Encounter: Payer: Self-pay | Admitting: Physical Therapy

## 2023-01-07 VITALS — BP 115/63 | HR 72

## 2023-01-07 DIAGNOSIS — R262 Difficulty in walking, not elsewhere classified: Secondary | ICD-10-CM | POA: Diagnosis present

## 2023-01-07 DIAGNOSIS — M25652 Stiffness of left hip, not elsewhere classified: Secondary | ICD-10-CM | POA: Insufficient documentation

## 2023-01-07 DIAGNOSIS — M25662 Stiffness of left knee, not elsewhere classified: Secondary | ICD-10-CM | POA: Insufficient documentation

## 2023-01-07 DIAGNOSIS — R2681 Unsteadiness on feet: Secondary | ICD-10-CM | POA: Insufficient documentation

## 2023-01-07 DIAGNOSIS — M6281 Muscle weakness (generalized): Secondary | ICD-10-CM | POA: Diagnosis present

## 2023-01-07 DIAGNOSIS — R293 Abnormal posture: Secondary | ICD-10-CM | POA: Insufficient documentation
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
## 2023-01-14 ENCOUNTER — Ambulatory Visit: Payer: Medicaid Other | Admitting: Physical Therapy

## 2023-01-14 DIAGNOSIS — R293 Abnormal posture: Secondary | ICD-10-CM

## 2023-01-14 DIAGNOSIS — R2681 Unsteadiness on feet: Secondary | ICD-10-CM

## 2023-01-14 DIAGNOSIS — M6281 Muscle weakness (generalized): Secondary | ICD-10-CM | POA: Diagnosis not present

## 2023-01-14 DIAGNOSIS — M25662 Stiffness of left knee, not elsewhere classified: Secondary | ICD-10-CM

## 2023-01-14 DIAGNOSIS — R262 Difficulty in walking, not elsewhere classified: Secondary | ICD-10-CM

## 2023-01-14 DIAGNOSIS — M25652 Stiffness of left hip, not elsewhere classified: Secondary | ICD-10-CM
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
## 2023-01-15 ENCOUNTER — Other Ambulatory Visit: Payer: Self-pay

## 2023-01-15 ENCOUNTER — Encounter: Payer: Self-pay | Admitting: Student

## 2023-01-15 ENCOUNTER — Ambulatory Visit (INDEPENDENT_AMBULATORY_CARE_PROVIDER_SITE_OTHER): Payer: Medicaid Other | Admitting: Student

## 2023-01-15 VITALS — BP 115/69 | HR 86 | Temp 98.2°F | Ht 72.0 in | Wt 157.3 lb

## 2023-01-15 DIAGNOSIS — Z0000 Encounter for general adult medical examination without abnormal findings: Secondary | ICD-10-CM

## 2023-01-15 DIAGNOSIS — M24562 Contracture, left knee: Secondary | ICD-10-CM

## 2023-01-15 DIAGNOSIS — Z79891 Long term (current) use of opiate analgesic: Secondary | ICD-10-CM

## 2023-01-15 DIAGNOSIS — G40909 Epilepsy, unspecified, not intractable, without status epilepticus: Secondary | ICD-10-CM | POA: Diagnosis not present

## 2023-01-15 NOTE — Assessment & Plan Note (Signed)
CMP today to monitor chronic pain medicine use, most recent 1 year ago WNL.

## 2023-01-15 NOTE — Assessment & Plan Note (Signed)
Pt doing well on oxycodone without side effects including constipation and drowsiness. Recently tried to scale back from TID but returned due to pain of walking unassisted again.

## 2023-01-15 NOTE — Patient Instructions (Signed)
Mr Lagow  Thank you for coming in today. This after visit summary is an important review of tests, referrals, and medication changes that were discussed during your visit. If you have questions or concerns, call the clinic at [phone] between 9am - 4pm. Outside of clinic business hours, call the main hospital at [phone] and ask the operator for the on-call internal medicine resident.   Best, Dr. Katheran James

## 2023-01-15 NOTE — Progress Notes (Signed)
Flexure contracture of left joint  PDMP reviewed  Pain contract 06/22
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
## 2023-01-15 NOTE — Assessment & Plan Note (Signed)
Pt taking Keppra BID and has not had a seizure in over 1 year.
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
## 2023-01-17 LAB — CMP14 + ANION GAP
ALT: 6 IU/L (ref 0–44)
AST: 8 IU/L (ref 0–40)
Albumin: 4.4 g/dL (ref 4.3–5.2)
Alkaline Phosphatase: 142 IU/L — ABNORMAL HIGH (ref 44–121)
Anion Gap: 16 mmol/L (ref 10.0–18.0)
BUN/Creatinine Ratio: 15 (ref 9–20)
BUN: 12 mg/dL (ref 6–20)
Bilirubin Total: 0.5 mg/dL (ref 0.0–1.2)
CO2: 25 mmol/L (ref 20–29)
Calcium: 9.3 mg/dL (ref 8.7–10.2)
Chloride: 99 mmol/L (ref 96–106)
Creatinine, Ser: 0.82 mg/dL (ref 0.76–1.27)
Globulin, Total: 2.5 g/dL (ref 1.5–4.5)
Glucose: 91 mg/dL (ref 70–99)
Potassium: 4.5 mmol/L (ref 3.5–5.2)
Sodium: 140 mmol/L (ref 134–144)
Total Protein: 6.9 g/dL (ref 6.0–8.5)
eGFR: 127 mL/min/{1.73_m2} (ref >59)

## 2023-01-18 LAB — TOXASSURE SELECT,+ANTIDEPR,UR

## 2023-01-21 ENCOUNTER — Encounter: Payer: Self-pay | Admitting: Physical Medicine and Rehabilitation

## 2023-01-21 ENCOUNTER — Encounter
Payer: Medicaid Other | Attending: Physical Medicine and Rehabilitation | Admitting: Physical Medicine and Rehabilitation

## 2023-01-21 ENCOUNTER — Ambulatory Visit: Payer: Medicaid Other | Admitting: Physical Therapy

## 2023-01-21 VITALS — BP 117/68 | HR 68 | Ht 72.0 in | Wt 159.0 lb

## 2023-01-21 DIAGNOSIS — M21372 Foot drop, left foot: Secondary | ICD-10-CM | POA: Insufficient documentation

## 2023-01-21 DIAGNOSIS — R269 Unspecified abnormalities of gait and mobility: Secondary | ICD-10-CM | POA: Diagnosis present

## 2023-01-21 DIAGNOSIS — M6281 Muscle weakness (generalized): Secondary | ICD-10-CM | POA: Diagnosis not present

## 2023-01-21 DIAGNOSIS — R293 Abnormal posture: Secondary | ICD-10-CM

## 2023-01-21 DIAGNOSIS — M25662 Stiffness of left knee, not elsewhere classified: Secondary | ICD-10-CM

## 2023-01-21 DIAGNOSIS — M25652 Stiffness of left hip, not elsewhere classified: Secondary | ICD-10-CM

## 2023-01-21 DIAGNOSIS — M792 Neuralgia and neuritis, unspecified: Secondary | ICD-10-CM | POA: Diagnosis present

## 2023-01-21 DIAGNOSIS — M24562 Contracture, left knee: Secondary | ICD-10-CM | POA: Insufficient documentation

## 2023-01-21 DIAGNOSIS — R2681 Unsteadiness on feet: Secondary | ICD-10-CM

## 2023-01-21 DIAGNOSIS — R262 Difficulty in walking, not elsewhere classified: Secondary | ICD-10-CM

## 2023-01-21 MED ORDER — DULOXETINE HCL 30 MG PO CPEP: 30.0000 mg | ORAL_CAPSULE | Freq: Two times a day (BID) | ORAL | 1 refills | Status: DC
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
## 2023-01-22 ENCOUNTER — Encounter: Payer: Self-pay | Admitting: Student

## 2023-01-25 ENCOUNTER — Telehealth: Payer: Self-pay | Admitting: Internal Medicine

## 2023-01-25 MED ORDER — OXYCODONE HCL 10 MG PO TABS: 10.0000 mg | ORAL_TABLET | Freq: Three times a day (TID) | ORAL | 0 refills | Status: AC | PRN

## 2023-01-25 NOTE — Telephone Encounter (Signed)
Patient called after hours pager requesting refill of oxycodone rx. He is prescribed oxycodone 10 mg TID and last picked it up 6/23 per PDMP review. Will send in refill at this time.

## 2023-01-30 NOTE — Progress Notes (Signed)
Internal Medicine Clinic Attending  I was physically present during the key portions of the resident provided service and participated in the medical decision making of patient's management care. I reviewed pertinent patient test results.  The assessment, diagnosis, and plan were formulated together and I agree with the documentation in the resident's note.  Gust Rung, DO

## 2023-02-15 ENCOUNTER — Emergency Department (HOSPITAL_COMMUNITY)
Admission: EM | Admit: 2023-02-15 | Discharge: 2023-02-15 | Disposition: A | Discharge status: Home / Self Care | Payer: Medicaid Other | Attending: Emergency Medicine | Admitting: Emergency Medicine

## 2023-02-15 ENCOUNTER — Emergency Department (HOSPITAL_COMMUNITY): Payer: Medicaid Other

## 2023-02-15 ENCOUNTER — Encounter (HOSPITAL_COMMUNITY): Payer: Self-pay

## 2023-02-15 DIAGNOSIS — Y92410 Unspecified street and highway as the place of occurrence of the external cause: Secondary | ICD-10-CM | POA: Insufficient documentation

## 2023-02-15 DIAGNOSIS — Z041 Encounter for examination and observation following transport accident: Secondary | ICD-10-CM | POA: Insufficient documentation

## 2023-02-15 DIAGNOSIS — G8310 Monoplegia of lower limb affecting unspecified side: Secondary | ICD-10-CM | POA: Insufficient documentation

## 2023-02-15 MED ORDER — LEVETIRACETAM 500 MG PO TABS
500.0000 mg | ORAL_TABLET | Freq: Once | ORAL | Status: AC
  Administered 2023-02-15: 500 mg via ORAL
  Filled 2023-02-15: qty 1
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
## 2023-02-18 ENCOUNTER — Telehealth: Payer: Self-pay | Admitting: *Deleted
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
## 2023-02-25 ENCOUNTER — Telehealth: Payer: Self-pay | Admitting: Student

## 2023-02-25 ENCOUNTER — Encounter
Payer: Medicaid Other | Attending: Physical Medicine and Rehabilitation | Admitting: Physical Medicine and Rehabilitation

## 2023-02-25 VITALS — BP 110/67 | HR 71 | Ht 72.0 in | Wt 155.0 lb

## 2023-02-25 DIAGNOSIS — M24562 Contracture, left knee: Secondary | ICD-10-CM | POA: Insufficient documentation

## 2023-02-25 DIAGNOSIS — G8310 Monoplegia of lower limb affecting unspecified side: Secondary | ICD-10-CM | POA: Insufficient documentation

## 2023-02-25 MED ORDER — ONABOTULINUMTOXINA 100 UNITS IJ SOLR
400.0000 [IU] | Freq: Once | INTRAMUSCULAR | Status: AC
Start: 2023-02-25 — End: 2023-02-25
  Administered 2023-02-25: 400 [IU] via INTRAMUSCULAR

## 2023-02-25 MED ORDER — OXYCODONE HCL 10 MG PO TABS
10.0000 mg | ORAL_TABLET | Freq: Three times a day (TID) | ORAL | 0 refills | Status: DC | PRN
Start: 2023-02-25 — End: 2023-03-25

## 2023-02-25 NOTE — Telephone Encounter (Signed)
Pt's medication is no longer on his Meds list  but is requesting the following medic   [id number] [place], [place] - [address], [place] [address] Phone: [phone]  Fax: [phone]    Oxycodone HCl 10 MG TABS
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
## 2023-02-27 ENCOUNTER — Ambulatory Visit (INDEPENDENT_AMBULATORY_CARE_PROVIDER_SITE_OTHER): Payer: Medicaid Other | Admitting: Student

## 2023-02-27 ENCOUNTER — Encounter: Payer: Self-pay | Admitting: Student

## 2023-02-27 VITALS — BP 139/90 | HR 88 | Ht 72.0 in | Wt 161.9 lb

## 2023-02-27 DIAGNOSIS — Z79891 Long term (current) use of opiate analgesic: Secondary | ICD-10-CM

## 2023-02-27 DIAGNOSIS — G40909 Epilepsy, unspecified, not intractable, without status epilepticus: Secondary | ICD-10-CM | POA: Diagnosis present

## 2023-02-27 DIAGNOSIS — M24562 Contracture, left knee: Secondary | ICD-10-CM

## 2023-02-27 NOTE — Progress Notes (Addendum)
Internal Medicine Clinic Attending  I was physically present during the key portions of the resident provided service and participated in the medical decision making of patient's management care. I reviewed pertinent patient test results.  The assessment, diagnosis, and plan were formulated together and I agree with the documentation in the resident's note.  Gust Rung, DO

## 2023-02-27 NOTE — Addendum Note (Signed)
Addended by: Gust Rung on: 02/27/2023 04:41 PM   Modules accepted: Level of Service
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
## 2023-02-27 NOTE — Patient Instructions (Addendum)
Thank you, [name] for allowing [place] to provide your care today. Today we discussed your recent hospitalization and pain management.   I have ordered the following labs for you:  Lab Orders  No laboratory test(s) ordered today     Tests ordered today:   Referrals ordered today:   Referral Orders         Ambulatory referral to Integrated Behavioral Health       I have ordered the following medication/changed the following medications:   Stop the following medications: Medications Discontinued During This Encounter  Medication Reason   DULoxetine (CYMBALTA) 30 MG capsule Patient Preference     Start the following medications: No orders of the defined types were placed in this encounter.    Follow up:   4 weeks   Remember:   - We have stopped your duloxetine, DO NOT take.   - Continue to take your Tylenol, gabapentin, and oxy as needed.   - Please do your best to assess the type of pain you are having over the next month with your lifestyle changes (no alcohol, reduced or no use of THC).   - IF YOU HAVE ANOTHER SPELL OR EPISODE, MESSAGE AND CALL us to move up your appointment.   - We will check on your neurology follow up and discuss at 1 month follow up.   Should you have any questions or concerns please call the internal medicine clinic at [phone].     Deziah Renwick Colbert Coyer, MD PGY-1 Internal Medicine Teaching [facility]
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
## 2023-02-27 NOTE — Assessment & Plan Note (Signed)
Patient taking oxy 10 mg up to TID as needed for severe pain. Reports he tries not to take it all the time and only as needed. Last refilled 02/25/23.

## 2023-03-25 ENCOUNTER — Other Ambulatory Visit: Payer: Self-pay | Admitting: Student

## 2023-03-25 DIAGNOSIS — M24562 Contracture, left knee: Secondary | ICD-10-CM

## 2023-03-25 NOTE — Telephone Encounter (Signed)
Oxycodone: Last rx written - 02/25/23. Last OV - 02/27/23. Next OV -has not been scheduled. TOX -01/15/23.

## 2023-03-25 NOTE — Telephone Encounter (Signed)
Pt states the pharmacy told him he will need a new Prescription written for  gabapentin (NEURONTIN) 400 MG capsule  [id number] [place], [place] - [address]    Oxycodone HCl 10 MG TABS   [id number] [place], [place]

## 2023-03-26 MED ORDER — OXYCODONE HCL 10 MG PO TABS
10.0000 mg | ORAL_TABLET | Freq: Three times a day (TID) | ORAL | 0 refills | Status: DC | PRN
Start: 2023-03-26 — End: 2023-04-22

## 2023-03-26 MED ORDER — GABAPENTIN 400 MG PO CAPS: ORAL_CAPSULE | ORAL | 5 refills | Status: DC

## 2023-04-02 ENCOUNTER — Ambulatory Visit (INDEPENDENT_AMBULATORY_CARE_PROVIDER_SITE_OTHER): Payer: Medicaid Other | Admitting: Licensed Clinical Social Worker

## 2023-04-02 DIAGNOSIS — S3669XA Other injury of rectum, initial encounter: Secondary | ICD-10-CM | POA: Diagnosis not present
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
## 2023-04-08 ENCOUNTER — Ambulatory Visit: Payer: Medicaid Other | Attending: Internal Medicine | Admitting: Physical Therapy

## 2023-04-08 DIAGNOSIS — M24562 Contracture, left knee: Secondary | ICD-10-CM | POA: Diagnosis not present

## 2023-04-08 DIAGNOSIS — M6281 Muscle weakness (generalized): Secondary | ICD-10-CM | POA: Diagnosis present

## 2023-04-08 DIAGNOSIS — R262 Difficulty in walking, not elsewhere classified: Secondary | ICD-10-CM | POA: Insufficient documentation

## 2023-04-08 DIAGNOSIS — R293 Abnormal posture: Secondary | ICD-10-CM | POA: Insufficient documentation

## 2023-04-08 DIAGNOSIS — R2681 Unsteadiness on feet: Secondary | ICD-10-CM | POA: Diagnosis present
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
## 2023-04-16 ENCOUNTER — Ambulatory Visit: Payer: Medicare Other | Attending: Internal Medicine

## 2023-04-16 DIAGNOSIS — M25662 Stiffness of left knee, not elsewhere classified: Secondary | ICD-10-CM | POA: Diagnosis present

## 2023-04-16 DIAGNOSIS — R2681 Unsteadiness on feet: Secondary | ICD-10-CM

## 2023-04-16 DIAGNOSIS — M6281 Muscle weakness (generalized): Secondary | ICD-10-CM | POA: Diagnosis present

## 2023-04-16 DIAGNOSIS — R262 Difficulty in walking, not elsewhere classified: Secondary | ICD-10-CM | POA: Diagnosis present

## 2023-04-16 DIAGNOSIS — R293 Abnormal posture: Secondary | ICD-10-CM | POA: Diagnosis present
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
## 2023-04-22 ENCOUNTER — Encounter
Payer: Medicare Other | Attending: Physical Medicine and Rehabilitation | Admitting: Physical Medicine and Rehabilitation

## 2023-04-22 ENCOUNTER — Other Ambulatory Visit: Payer: Self-pay | Admitting: Student

## 2023-04-22 DIAGNOSIS — M21372 Foot drop, left foot: Secondary | ICD-10-CM | POA: Insufficient documentation

## 2023-04-22 DIAGNOSIS — M24562 Contracture, left knee: Secondary | ICD-10-CM

## 2023-04-22 MED ORDER — OXYCODONE HCL 10 MG PO TABS: 10.0000 mg | ORAL_TABLET | Freq: Three times a day (TID) | ORAL | 0 refills | Status: DC | PRN

## 2023-04-22 NOTE — Telephone Encounter (Signed)
Last rx written - 03/26/23. Last OV - 02/27/23. Next OV - has not been scheduled. TOX - 01/15/23.

## 2023-04-22 NOTE — Telephone Encounter (Signed)
Oxycodone HCl 10 MG TABS   [id number] - [place], [place] - [address]

## 2023-04-23 ENCOUNTER — Ambulatory Visit: Payer: Medicare Other | Admitting: Student

## 2023-04-23 ENCOUNTER — Telehealth: Payer: Self-pay

## 2023-04-23 ENCOUNTER — Encounter: Payer: Self-pay | Admitting: Student

## 2023-04-23 DIAGNOSIS — G40909 Epilepsy, unspecified, not intractable, without status epilepticus: Secondary | ICD-10-CM

## 2023-04-23 NOTE — Telephone Encounter (Signed)
Prior Authorization for patient (Oxycodone HCI 10 MG) came through on cover my meds was submitted with last office notes awaiting approval or denial.  KEY:[id number]
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
## 2023-04-24 ENCOUNTER — Ambulatory Visit: Payer: Medicare Other | Admitting: Physical Therapy

## 2023-04-24 DIAGNOSIS — R293 Abnormal posture: Secondary | ICD-10-CM

## 2023-04-24 DIAGNOSIS — R2681 Unsteadiness on feet: Secondary | ICD-10-CM

## 2023-04-24 DIAGNOSIS — M6281 Muscle weakness (generalized): Secondary | ICD-10-CM | POA: Diagnosis not present

## 2023-04-24 DIAGNOSIS — M25662 Stiffness of left knee, not elsewhere classified: Secondary | ICD-10-CM

## 2023-04-24 DIAGNOSIS — R262 Difficulty in walking, not elsewhere classified: Secondary | ICD-10-CM
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
## 2023-04-24 NOTE — Telephone Encounter (Signed)
Decision:Approved  Cliford Sequeira (Key: [id number]) PA Case ID #: [id number] Need Help? Call us at [phone] Outcome Approved on October 15 by [facility] 2017 NCPDP Request Reference Number: [id number]. OXYCODONE TAB 10MG  is approved through 10/22/2023. For further questions, call [employer] at [phone]. Authorization Expiration Date: 10/22/2023 Drug oxyCODONE HCl 10MG  tablets ePA cloud logo Form [employer] Electronic Prior Authorization Form [phone] NCPDP)
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
## 2023-04-29 ENCOUNTER — Ambulatory Visit: Payer: Medicare Other | Admitting: Physical Therapy

## 2023-04-29 ENCOUNTER — Encounter: Payer: Self-pay | Admitting: Physical Medicine and Rehabilitation

## 2023-04-29 ENCOUNTER — Encounter (HOSPITAL_BASED_OUTPATIENT_CLINIC_OR_DEPARTMENT_OTHER): Payer: Medicare Other | Admitting: Physical Medicine and Rehabilitation

## 2023-04-29 VITALS — BP 125/78 | HR 89 | Ht 72.0 in | Wt 162.0 lb

## 2023-04-29 DIAGNOSIS — R2681 Unsteadiness on feet: Secondary | ICD-10-CM

## 2023-04-29 DIAGNOSIS — R262 Difficulty in walking, not elsewhere classified: Secondary | ICD-10-CM

## 2023-04-29 DIAGNOSIS — M6281 Muscle weakness (generalized): Secondary | ICD-10-CM | POA: Diagnosis not present

## 2023-04-29 DIAGNOSIS — M21372 Foot drop, left foot: Secondary | ICD-10-CM | POA: Diagnosis present

## 2023-04-29 DIAGNOSIS — M25662 Stiffness of left knee, not elsewhere classified: Secondary | ICD-10-CM

## 2023-04-29 DIAGNOSIS — M24562 Contracture, left knee: Secondary | ICD-10-CM | POA: Diagnosis not present

## 2023-04-29 DIAGNOSIS — R293 Abnormal posture: Secondary | ICD-10-CM
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
## 2023-04-30 NOTE — Progress Notes (Signed)
Internal Medicine Clinic Attending  Case discussed with the resident at the time of the visit.  We reviewed the resident's history and exam and pertinent patient test results.  I agree with the assessment, diagnosis, and plan of care documented in the resident's note.  

## 2023-05-02 ENCOUNTER — Ambulatory Visit (INDEPENDENT_AMBULATORY_CARE_PROVIDER_SITE_OTHER): Payer: Self-pay | Admitting: Licensed Clinical Social Worker

## 2023-05-02 DIAGNOSIS — S3669XA Other injury of rectum, initial encounter: Secondary | ICD-10-CM
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
## 2023-05-06 ENCOUNTER — Ambulatory Visit: Payer: Medicare Other | Admitting: Physical Therapy

## 2023-05-06 DIAGNOSIS — M6281 Muscle weakness (generalized): Secondary | ICD-10-CM

## 2023-05-06 DIAGNOSIS — R262 Difficulty in walking, not elsewhere classified: Secondary | ICD-10-CM

## 2023-05-06 DIAGNOSIS — R293 Abnormal posture: Secondary | ICD-10-CM

## 2023-05-06 DIAGNOSIS — R2681 Unsteadiness on feet: Secondary | ICD-10-CM
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
## 2023-05-13 NOTE — Telephone Encounter (Signed)
Patient had a telehealth visit with you on 04/23/2023.  Does he still need another future appointment?  Please advise.

## 2023-05-14 ENCOUNTER — Ambulatory Visit: Payer: Medicare Other | Attending: Internal Medicine | Admitting: Physical Therapy

## 2023-05-14 DIAGNOSIS — R262 Difficulty in walking, not elsewhere classified: Secondary | ICD-10-CM

## 2023-05-14 DIAGNOSIS — R293 Abnormal posture: Secondary | ICD-10-CM

## 2023-05-14 DIAGNOSIS — M25652 Stiffness of left hip, not elsewhere classified: Secondary | ICD-10-CM | POA: Diagnosis present

## 2023-05-14 DIAGNOSIS — M25662 Stiffness of left knee, not elsewhere classified: Secondary | ICD-10-CM | POA: Diagnosis present

## 2023-05-14 DIAGNOSIS — R2681 Unsteadiness on feet: Secondary | ICD-10-CM | POA: Diagnosis present

## 2023-05-14 DIAGNOSIS — M6281 Muscle weakness (generalized): Secondary | ICD-10-CM
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
## 2023-05-20 ENCOUNTER — Ambulatory Visit: Payer: Medicare Other | Admitting: Physical Therapy

## 2023-05-20 ENCOUNTER — Other Ambulatory Visit: Payer: Self-pay

## 2023-05-20 DIAGNOSIS — M6281 Muscle weakness (generalized): Secondary | ICD-10-CM

## 2023-05-20 DIAGNOSIS — R293 Abnormal posture: Secondary | ICD-10-CM

## 2023-05-20 DIAGNOSIS — R2681 Unsteadiness on feet: Secondary | ICD-10-CM

## 2023-05-20 DIAGNOSIS — R262 Difficulty in walking, not elsewhere classified: Secondary | ICD-10-CM

## 2023-05-20 DIAGNOSIS — M24562 Contracture, left knee: Secondary | ICD-10-CM

## 2023-05-20 MED ORDER — LEVETIRACETAM 500 MG PO TABS: ORAL_TABLET | ORAL | 2 refills | Status: DC

## 2023-05-20 MED ORDER — OXYCODONE HCL 10 MG PO TABS
10.0000 mg | ORAL_TABLET | Freq: Three times a day (TID) | ORAL | 0 refills | Status: DC | PRN
Start: 2023-05-20 — End: 2023-06-17
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
## 2023-05-21 ENCOUNTER — Telehealth: Payer: Self-pay | Admitting: *Deleted

## 2023-05-21 NOTE — Telephone Encounter (Signed)
Received a call from pt's mother who stated pt returned from the store and walked into her room. Stated pt had a blanked stare, shaking, rolling his tongue, and not talking. She called his name with no response. Stated this did not last long. Then he "snapped out of it"; stated he does not remember anything. His mother stated this happened once before about 2 weeks ago (as far as she knows). I talked to the pt, he stated he feels fine; no change in mental status. Stated he has been taking his meds as prescribed. Appt has been schedule for Thurs 11/14 @ 1045 AM.

## 2023-05-23 ENCOUNTER — Ambulatory Visit: Payer: Medicare Other | Admitting: Student

## 2023-05-23 ENCOUNTER — Encounter: Payer: Self-pay | Admitting: Student

## 2023-05-23 VITALS — BP 123/64 | HR 86 | Temp 98.2°F | Ht 72.0 in | Wt 166.8 lb

## 2023-05-23 DIAGNOSIS — G40909 Epilepsy, unspecified, not intractable, without status epilepticus: Secondary | ICD-10-CM | POA: Diagnosis present

## 2023-05-23 MED ORDER — LEVETIRACETAM 1000 MG PO TABS
1000.0000 mg | ORAL_TABLET | Freq: Two times a day (BID) | ORAL | 5 refills | Status: DC
Start: 2023-05-23 — End: 2023-06-19
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
## 2023-05-23 NOTE — Patient Instructions (Signed)
Thank you so much for coming to the clinic today!   For your seizures, you can start taking the Keppra 1000 mg twice per day. This will be two pills in the morning and two at night for your current prescription that you just picked up.   Once you run out, I have placed another prescription for 1000 mg pills rather than your current 500 mg pills.   I have also placed a referral to neurology, so they should be calling you in the coming days.   If you have any questions please feel free to the call the clinic at anytime at [phone]. It was a pleasure seeing you!  Best, Dr. Rayvon Char

## 2023-05-23 NOTE — Assessment & Plan Note (Signed)
Patient notes that over the past couple weeks, his mother has noticed new staring episodes, she will have a blank stare on his face and will be unresponsive.  These will typically last a few minutes, and the patient will be a bit confused after but does not over the episodes.  He denies any tongue lacerations at the end of these episodes.  There is concern that these may be new absence seizures.  We will place referral for neurology for further workup.  In the meantime, we will increase his Keppra to 1000 mg twice daily. - Increase Keppra to 1000 mg twice daily - Neurology referral

## 2023-05-28 ENCOUNTER — Telehealth: Payer: Self-pay | Admitting: *Deleted

## 2023-05-28 ENCOUNTER — Ambulatory Visit: Payer: Medicare Other | Admitting: Physical Therapy

## 2023-05-28 NOTE — Telephone Encounter (Signed)
Call from patient's mother states patient is continuing to have seizures.  Wants to know about getting a referral to the Neurologist. Patient is taking his medication.  Was not in with visit last week with patient.  Would like to speak with a doctor about the plans.  RTC to patient's mother a Neurologist referral has been made.  Awaiting response from Neurology with an appointment.

## 2023-05-29 ENCOUNTER — Encounter: Payer: Medicare HMO | Admitting: Physical Medicine and Rehabilitation

## 2023-05-31 NOTE — Progress Notes (Signed)
Internal Medicine Clinic Attending  Case discussed with the resident at the time of the visit.  We reviewed the resident's history and exam and pertinent patient test results.  I agree with the assessment, diagnosis, and plan of care documented in the resident's note.  

## 2023-06-03 ENCOUNTER — Ambulatory Visit: Payer: Medicare Other | Admitting: Physical Therapy

## 2023-06-03 ENCOUNTER — Encounter: Payer: Self-pay | Admitting: Physical Therapy

## 2023-06-03 VITALS — BP 128/75 | HR 84

## 2023-06-03 DIAGNOSIS — M6281 Muscle weakness (generalized): Secondary | ICD-10-CM

## 2023-06-03 DIAGNOSIS — M25662 Stiffness of left knee, not elsewhere classified: Secondary | ICD-10-CM

## 2023-06-03 DIAGNOSIS — R2681 Unsteadiness on feet: Secondary | ICD-10-CM

## 2023-06-03 DIAGNOSIS — M25652 Stiffness of left hip, not elsewhere classified: Secondary | ICD-10-CM

## 2023-06-03 DIAGNOSIS — R293 Abnormal posture: Secondary | ICD-10-CM

## 2023-06-03 DIAGNOSIS — R262 Difficulty in walking, not elsewhere classified: Secondary | ICD-10-CM
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
## 2023-06-10 ENCOUNTER — Ambulatory Visit: Payer: Medicaid Other | Admitting: Physical Therapy

## 2023-06-10 ENCOUNTER — Encounter: Payer: Medicare HMO | Admitting: Physical Medicine and Rehabilitation

## 2023-06-14 ENCOUNTER — Ambulatory Visit: Payer: Medicare HMO | Attending: Internal Medicine | Admitting: Physical Therapy

## 2023-06-14 DIAGNOSIS — M25662 Stiffness of left knee, not elsewhere classified: Secondary | ICD-10-CM | POA: Diagnosis not present

## 2023-06-14 DIAGNOSIS — R262 Difficulty in walking, not elsewhere classified: Secondary | ICD-10-CM | POA: Diagnosis not present

## 2023-06-14 DIAGNOSIS — R293 Abnormal posture: Secondary | ICD-10-CM | POA: Diagnosis not present

## 2023-06-14 DIAGNOSIS — M25652 Stiffness of left hip, not elsewhere classified: Secondary | ICD-10-CM | POA: Diagnosis not present

## 2023-06-14 DIAGNOSIS — R2681 Unsteadiness on feet: Secondary | ICD-10-CM

## 2023-06-14 DIAGNOSIS — M6281 Muscle weakness (generalized): Secondary | ICD-10-CM | POA: Diagnosis not present

## 2023-06-14 NOTE — Therapy (Deleted)
[facility] [address] [place], [place], [address] Phone: [phone]   Fax:  [phone]  Patient Details  Name: Jonathan [name] MRN: [id number] Date of Birth: 02-Dec-2000 Referring Provider:  Gust Rung, DO  Encounter Date: 06/14/2023   Peter Congo, PT 06/14/2023, 10:18 AM  [facility] [address] [place], [place], [address] Phone: [phone]   Fax:  [phone]
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
## 2023-06-17 ENCOUNTER — Other Ambulatory Visit: Payer: Self-pay | Admitting: *Deleted

## 2023-06-17 ENCOUNTER — Ambulatory Visit: Payer: Medicare HMO | Admitting: Physical Therapy

## 2023-06-17 DIAGNOSIS — M24562 Contracture, left knee: Secondary | ICD-10-CM

## 2023-06-17 NOTE — Telephone Encounter (Signed)
Patient requesting refill on Oxycodone. Pharmacy is [facility].

## 2023-06-17 NOTE — Telephone Encounter (Signed)
Patient requesting refill on Oxycodone/.  Last fill 05/20/2023.  Last [facility] 01/15/2023 WNL.  Last appointment 05/23/2023. Next appointment 06/24/2023.

## 2023-06-18 ENCOUNTER — Telehealth: Payer: Self-pay

## 2023-06-18 MED ORDER — OXYCODONE HCL 10 MG PO TABS
10.0000 mg | ORAL_TABLET | Freq: Three times a day (TID) | ORAL | 0 refills | Status: DC | PRN
Start: 2023-06-18 — End: 2023-07-15

## 2023-06-18 NOTE — Telephone Encounter (Addendum)
Pt called to info  clinic that he is in need of  a  new PA for his OXY .Marland Kitchen Due  to change of insurance [employer]   RX BIN :[id number]   RX PCN : [id number]  RX GROUP : [id number]     MEM ID : [id number]      While trying to do the PA  .Marland Kitchen A message  popped up :    Information regarding your request The PA system cannot find any matching drug for the NDC/Drug sent. PA cannot be created. Please contact your system administrator.

## 2023-06-19 ENCOUNTER — Ambulatory Visit: Payer: Medicare Other | Admitting: Neurology

## 2023-06-19 ENCOUNTER — Telehealth: Payer: Self-pay

## 2023-06-19 ENCOUNTER — Encounter: Payer: Self-pay | Admitting: Neurology

## 2023-06-19 VITALS — BP 144/80 | HR 89 | Ht 72.0 in | Wt 161.0 lb

## 2023-06-19 DIAGNOSIS — Z5181 Encounter for therapeutic drug level monitoring: Secondary | ICD-10-CM

## 2023-06-19 DIAGNOSIS — G40909 Epilepsy, unspecified, not intractable, without status epilepticus: Secondary | ICD-10-CM | POA: Diagnosis not present

## 2023-06-19 MED ORDER — VALTOCO 20 MG DOSE 10 MG/0.1ML NA LQPK: 20.0000 mg | NASAL | 1 refills | Status: DC | PRN

## 2023-06-19 MED ORDER — LACOSAMIDE 50 MG PO TABS: 50.0000 mg | ORAL_TABLET | Freq: Two times a day (BID) | ORAL | 5 refills | Status: DC

## 2023-06-19 MED ORDER — LEVETIRACETAM 1000 MG PO TABS: 1000.0000 mg | ORAL_TABLET | Freq: Two times a day (BID) | ORAL | 3 refills | Status: DC

## 2023-06-19 NOTE — Patient Instructions (Addendum)
Continue with Keppra 1000 mg twice daily Will check a Keppra level Start Vimpat 50 mg twice daily Will also prescribed Valtoco as a rescue medication Please contact me if you do have a seizure, seizure activity or any side effect from the medications. Follow-up in 3 months or sooner if worse.
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
## 2023-06-19 NOTE — Telephone Encounter (Signed)
Mychart message sent.

## 2023-06-20 LAB — COMPREHENSIVE METABOLIC PANEL
ALT: 6 [IU]/L (ref 0–44)
AST: 12 [IU]/L (ref 0–40)
Albumin: 4.7 g/dL (ref 4.3–5.2)
Alkaline Phosphatase: 79 [IU]/L (ref 44–121)
BUN/Creatinine Ratio: 13 (ref 9–20)
BUN: 12 mg/dL (ref 6–20)
Bilirubin Total: 1 mg/dL (ref 0.0–1.2)
CO2: 25 mmol/L (ref 20–29)
Calcium: 9.7 mg/dL (ref 8.7–10.2)
Chloride: 101 mmol/L (ref 96–106)
Creatinine, Ser: 0.92 mg/dL (ref 0.76–1.27)
Globulin, Total: 2.5 g/dL (ref 1.5–4.5)
Glucose: 89 mg/dL (ref 70–99)
Potassium: 3.7 mmol/L (ref 3.5–5.2)
Sodium: 141 mmol/L (ref 134–144)
Total Protein: 7.2 g/dL (ref 6.0–8.5)
eGFR: 119 mL/min/{1.73_m2} (ref >59)

## 2023-06-20 LAB — LEVETIRACETAM LEVEL: Levetiracetam Lvl: 19 ug/mL (ref 10.0–40.0)

## 2023-06-24 ENCOUNTER — Ambulatory Visit (INDEPENDENT_AMBULATORY_CARE_PROVIDER_SITE_OTHER): Payer: Medicare HMO | Admitting: Student

## 2023-06-24 VITALS — BP 133/70 | HR 75 | Temp 98.4°F | Ht 72.0 in | Wt 160.1 lb

## 2023-06-24 DIAGNOSIS — G40909 Epilepsy, unspecified, not intractable, without status epilepticus: Secondary | ICD-10-CM
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
## 2023-06-24 NOTE — Patient Instructions (Signed)
  Thank you, [name], for allowing [place] to provide your care today.  I am very glad that you are doing well and that you got to see the neurologist.  I am going to keep an eye on your chart for the nasal spray medication and I want you to call our clinic if you are having any trouble with the neurology clinic and we can try to help get you a medication that similar at least.  I will see you again in about 3 months but as always give [place] a call if you need anything before then.   Follow up: 3 months    Remember:     Should you have any questions or concerns please call the internal medicine clinic at [phone].     [name], DO [facility]

## 2023-06-25 ENCOUNTER — Encounter: Payer: Self-pay | Admitting: Neurology

## 2023-06-25 ENCOUNTER — Ambulatory Visit (INDEPENDENT_AMBULATORY_CARE_PROVIDER_SITE_OTHER): Payer: Medicare HMO | Admitting: Neurology

## 2023-06-25 ENCOUNTER — Ambulatory Visit: Payer: Medicaid Other | Admitting: Physical Therapy

## 2023-06-25 DIAGNOSIS — G40909 Epilepsy, unspecified, not intractable, without status epilepticus: Secondary | ICD-10-CM | POA: Diagnosis not present

## 2023-06-25 NOTE — Procedures (Signed)
    History:  22 year old man with seizure disorder   EEG classification: Awake and drowsy  Duration: 28 minutes   Technical aspects: This EEG study was done with scalp electrodes positioned according to the 10-20 International system of electrode placement. Electrical activity was reviewed with band pass filter of 1-70Hz , sensitivity of 7 uV/mm, display speed of 72mm/sec with a 60Hz  notched filter applied as appropriate. EEG data were recorded continuously and digitally stored.   Description of the recording: The background rhythms of this recording consists of a fairly well modulated medium amplitude alpha rhythm of 10 Hz that is reactive to eye opening and closure. Present in the anterior head region is a 15-20 Hz beta activity. Photic stimulation was performed, did not show any abnormalities. Hyperventilation was also performed, did not show any abnormalities. Drowsiness was manifested by background fragmentation. There were presence of frequent right temporal sharp and slow waves. There was right temporal focal slowing. There were no electrographic seizure identified.   Abnormality:  Frequent right temporal epileptiform discharges  Right temporal focal slowing   Impression: This is an abnormal EEG recorded while drowsy and awake due to presence of right temporal epileptiform discharges and focal slowing. This is consistent with an area of increase epileptogenic potential and neuronal dysfunction in the right temporal region.           Windell Norfolk, MD [facility]

## 2023-06-26 ENCOUNTER — Telehealth: Payer: Self-pay

## 2023-06-26 ENCOUNTER — Encounter: Payer: Self-pay | Admitting: Physical Medicine and Rehabilitation

## 2023-06-26 ENCOUNTER — Encounter: Payer: Medicare HMO | Attending: Physical Medicine and Rehabilitation | Admitting: Physical Medicine and Rehabilitation

## 2023-06-26 VITALS — BP 119/72 | HR 77 | Ht 72.0 in | Wt 156.0 lb

## 2023-06-26 DIAGNOSIS — G8310 Monoplegia of lower limb affecting unspecified side: Secondary | ICD-10-CM | POA: Diagnosis not present

## 2023-06-26 DIAGNOSIS — M21372 Foot drop, left foot: Secondary | ICD-10-CM | POA: Insufficient documentation

## 2023-06-26 MED ORDER — SODIUM CHLORIDE (PF) 0.9 % IJ SOLN
4.0000 mL | Freq: Once | INTRAMUSCULAR | Status: AC
Start: 2023-06-26 — End: 2023-06-26
  Administered 2023-06-26: 4 mL

## 2023-06-26 MED ORDER — ONABOTULINUMTOXINA 100 UNITS IJ SOLR
400.0000 [IU] | Freq: Once | INTRAMUSCULAR | Status: AC
Start: 2023-06-26 — End: 2023-06-26
  Administered 2023-06-26: 400 [IU] via INTRAMUSCULAR

## 2023-06-26 NOTE — Patient Instructions (Signed)
Follow up Q3M for botox

## 2023-06-26 NOTE — Telephone Encounter (Signed)
Information given to Sims with Valtoco for medication management
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
## 2023-06-28 NOTE — Assessment & Plan Note (Signed)
Since our last visit the patient has established with neurology on 06/19/2023.  They are planning on getting an MRI and EEG which he is scheduled for.  They have also continued his Keppra and start him on Vimpat.  He is working on getting nasal diazepam but currently they are working through his insurance due to the price. - Continue Keppra 1000 mg twice daily and Vimpat 50 mg twice daily - Continue with planned EEG and MRI

## 2023-06-28 NOTE — Progress Notes (Signed)
Internal Medicine Clinic Attending  Case discussed with the resident at the time of the visit.  We reviewed the resident's history and exam and pertinent patient test results.  I agree with the assessment, diagnosis, and plan of care documented in the resident's note.  

## 2023-07-01 ENCOUNTER — Ambulatory Visit: Payer: Medicare HMO | Admitting: Physical Therapy

## 2023-07-01 DIAGNOSIS — M25652 Stiffness of left hip, not elsewhere classified: Secondary | ICD-10-CM

## 2023-07-01 DIAGNOSIS — M6281 Muscle weakness (generalized): Secondary | ICD-10-CM

## 2023-07-01 DIAGNOSIS — R2681 Unsteadiness on feet: Secondary | ICD-10-CM | POA: Diagnosis not present

## 2023-07-01 DIAGNOSIS — R293 Abnormal posture: Secondary | ICD-10-CM | POA: Diagnosis not present

## 2023-07-01 DIAGNOSIS — M25662 Stiffness of left knee, not elsewhere classified: Secondary | ICD-10-CM | POA: Diagnosis not present

## 2023-07-01 DIAGNOSIS — R262 Difficulty in walking, not elsewhere classified: Secondary | ICD-10-CM | POA: Diagnosis not present
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
## 2023-07-08 ENCOUNTER — Ambulatory Visit: Payer: Medicare HMO | Admitting: Physical Therapy

## 2023-07-15 ENCOUNTER — Ambulatory Visit: Payer: Medicare HMO | Attending: Internal Medicine

## 2023-07-15 ENCOUNTER — Other Ambulatory Visit: Payer: Self-pay

## 2023-07-15 DIAGNOSIS — M6281 Muscle weakness (generalized): Secondary | ICD-10-CM | POA: Diagnosis not present

## 2023-07-15 DIAGNOSIS — M25652 Stiffness of left hip, not elsewhere classified: Secondary | ICD-10-CM | POA: Insufficient documentation

## 2023-07-15 DIAGNOSIS — M25662 Stiffness of left knee, not elsewhere classified: Secondary | ICD-10-CM | POA: Insufficient documentation

## 2023-07-15 DIAGNOSIS — R2681 Unsteadiness on feet: Secondary | ICD-10-CM | POA: Insufficient documentation

## 2023-07-15 DIAGNOSIS — M24562 Contracture, left knee: Secondary | ICD-10-CM

## 2023-07-15 DIAGNOSIS — R293 Abnormal posture: Secondary | ICD-10-CM | POA: Diagnosis not present

## 2023-07-15 DIAGNOSIS — R262 Difficulty in walking, not elsewhere classified: Secondary | ICD-10-CM | POA: Insufficient documentation

## 2023-07-15 MED ORDER — OXYCODONE HCL 10 MG PO TABS: 10.0000 mg | ORAL_TABLET | Freq: Three times a day (TID) | ORAL | 0 refills | Status: DC | PRN
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
## 2023-07-24 ENCOUNTER — Ambulatory Visit: Payer: Medicare HMO | Admitting: Physical Therapy

## 2023-07-24 DIAGNOSIS — R293 Abnormal posture: Secondary | ICD-10-CM | POA: Diagnosis not present

## 2023-07-24 DIAGNOSIS — R262 Difficulty in walking, not elsewhere classified: Secondary | ICD-10-CM | POA: Diagnosis not present

## 2023-07-24 DIAGNOSIS — M6281 Muscle weakness (generalized): Secondary | ICD-10-CM | POA: Diagnosis not present

## 2023-07-24 DIAGNOSIS — M25652 Stiffness of left hip, not elsewhere classified: Secondary | ICD-10-CM | POA: Diagnosis not present

## 2023-07-24 DIAGNOSIS — R2681 Unsteadiness on feet: Secondary | ICD-10-CM | POA: Diagnosis not present

## 2023-07-24 DIAGNOSIS — M25662 Stiffness of left knee, not elsewhere classified: Secondary | ICD-10-CM
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
## 2023-07-29 ENCOUNTER — Ambulatory Visit: Payer: Medicare HMO | Admitting: Physical Therapy

## 2023-08-02 ENCOUNTER — Ambulatory Visit: Payer: Medicare HMO | Admitting: Physical Therapy

## 2023-08-02 DIAGNOSIS — R2681 Unsteadiness on feet: Secondary | ICD-10-CM | POA: Diagnosis not present

## 2023-08-02 DIAGNOSIS — R262 Difficulty in walking, not elsewhere classified: Secondary | ICD-10-CM | POA: Diagnosis not present

## 2023-08-02 DIAGNOSIS — M25652 Stiffness of left hip, not elsewhere classified: Secondary | ICD-10-CM | POA: Diagnosis not present

## 2023-08-02 DIAGNOSIS — M25662 Stiffness of left knee, not elsewhere classified: Secondary | ICD-10-CM | POA: Diagnosis not present

## 2023-08-02 DIAGNOSIS — R293 Abnormal posture: Secondary | ICD-10-CM | POA: Diagnosis not present

## 2023-08-02 DIAGNOSIS — M6281 Muscle weakness (generalized): Secondary | ICD-10-CM
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
## 2023-08-05 ENCOUNTER — Ambulatory Visit: Payer: Self-pay | Admitting: Physical Therapy

## 2023-08-12 ENCOUNTER — Ambulatory Visit: Payer: Medicare HMO | Attending: Internal Medicine | Admitting: Physical Therapy

## 2023-08-12 DIAGNOSIS — M25662 Stiffness of left knee, not elsewhere classified: Secondary | ICD-10-CM | POA: Diagnosis not present

## 2023-08-12 DIAGNOSIS — M25652 Stiffness of left hip, not elsewhere classified: Secondary | ICD-10-CM

## 2023-08-12 DIAGNOSIS — R262 Difficulty in walking, not elsewhere classified: Secondary | ICD-10-CM | POA: Diagnosis not present

## 2023-08-12 DIAGNOSIS — R293 Abnormal posture: Secondary | ICD-10-CM

## 2023-08-12 DIAGNOSIS — M6281 Muscle weakness (generalized): Secondary | ICD-10-CM | POA: Diagnosis not present

## 2023-08-12 DIAGNOSIS — R2681 Unsteadiness on feet: Secondary | ICD-10-CM | POA: Diagnosis not present
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
## 2023-08-14 ENCOUNTER — Telehealth: Payer: Self-pay | Admitting: Physical Therapy

## 2023-08-14 DIAGNOSIS — G8310 Monoplegia of lower limb affecting unspecified side: Secondary | ICD-10-CM

## 2023-08-14 DIAGNOSIS — R269 Unspecified abnormalities of gait and mobility: Secondary | ICD-10-CM
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
## 2023-08-15 NOTE — Telephone Encounter (Signed)
 Order sent, and I will get the [id number] done at our appointment. Thanks for reaching out!

## 2023-08-16 ENCOUNTER — Other Ambulatory Visit: Payer: Self-pay

## 2023-08-16 DIAGNOSIS — M24562 Contracture, left knee: Secondary | ICD-10-CM

## 2023-08-16 MED ORDER — OXYCODONE HCL 10 MG PO TABS: 10.0000 mg | ORAL_TABLET | Freq: Three times a day (TID) | ORAL | 0 refills | Status: DC | PRN

## 2023-08-19 ENCOUNTER — Ambulatory Visit: Payer: Self-pay | Admitting: Physical Therapy

## 2023-08-22 ENCOUNTER — Ambulatory Visit: Payer: Medicare HMO | Admitting: Neurology

## 2023-08-22 ENCOUNTER — Encounter: Payer: Self-pay | Admitting: Neurology

## 2023-08-22 DIAGNOSIS — G40019 Localization-related (focal) (partial) idiopathic epilepsy and epileptic syndromes with seizures of localized onset, intractable, without status epilepticus: Secondary | ICD-10-CM | POA: Diagnosis not present

## 2023-08-22 MED ORDER — LEVETIRACETAM 1000 MG PO TABS
2000.0000 mg | ORAL_TABLET | Freq: Two times a day (BID) | ORAL | 3 refills | Status: AC
Start: 2023-08-22 — End: 2024-08-16

## 2023-08-22 MED ORDER — LACOSAMIDE 100 MG PO TABS: 100.0000 mg | ORAL_TABLET | Freq: Two times a day (BID) | ORAL | 5 refills | Status: DC

## 2023-08-22 NOTE — Patient Instructions (Signed)
Increase Keppra to 2000 mg twice daily Increase lacosamide to 100 mg twice daily MRI brain with and without contrast Please contact me if you have any side effect from the medication Please contact me if you have any breakthrough seizure or any other concerns Follow-up in 3 months or sooner if worse.
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
## 2023-09-02 ENCOUNTER — Encounter: Payer: Medicare HMO | Attending: Physical Medicine and Rehabilitation | Admitting: Physical Medicine and Rehabilitation

## 2023-09-02 ENCOUNTER — Telehealth: Payer: Self-pay | Admitting: Neurology

## 2023-09-02 VITALS — BP 99/65 | HR 82 | Ht 72.0 in | Wt 165.0 lb

## 2023-09-02 DIAGNOSIS — G8310 Monoplegia of lower limb affecting unspecified side: Secondary | ICD-10-CM | POA: Insufficient documentation

## 2023-09-02 DIAGNOSIS — M21372 Foot drop, left foot: Secondary | ICD-10-CM | POA: Diagnosis not present

## 2023-09-02 DIAGNOSIS — R269 Unspecified abnormalities of gait and mobility: Secondary | ICD-10-CM | POA: Insufficient documentation

## 2023-09-02 NOTE — Telephone Encounter (Signed)
 Ethlyn Gallery: [id number] exp. 09/02/23-11/01/23 sent to GI [phone]
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
## 2023-09-02 NOTE — Patient Instructions (Addendum)
 Script for new Afo given today; will submit as face to face evaluation  Will change appointment upcoming from botox (no longer needed due to no spasticity) to EMG LLE.

## 2023-09-09 ENCOUNTER — Ambulatory Visit: Payer: Medicare HMO | Attending: Internal Medicine | Admitting: Physical Therapy

## 2023-09-09 DIAGNOSIS — R269 Unspecified abnormalities of gait and mobility: Secondary | ICD-10-CM | POA: Insufficient documentation

## 2023-09-09 DIAGNOSIS — R262 Difficulty in walking, not elsewhere classified: Secondary | ICD-10-CM | POA: Insufficient documentation

## 2023-09-09 DIAGNOSIS — M6281 Muscle weakness (generalized): Secondary | ICD-10-CM | POA: Insufficient documentation

## 2023-09-09 DIAGNOSIS — R2681 Unsteadiness on feet: Secondary | ICD-10-CM | POA: Diagnosis not present
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
## 2023-09-10 ENCOUNTER — Ambulatory Visit (INDEPENDENT_AMBULATORY_CARE_PROVIDER_SITE_OTHER): Payer: Medicare HMO | Admitting: Student

## 2023-09-10 VITALS — BP 123/61 | HR 68 | Temp 98.9°F | Ht 72.0 in | Wt 164.9 lb

## 2023-09-10 DIAGNOSIS — M24562 Contracture, left knee: Secondary | ICD-10-CM | POA: Diagnosis not present

## 2023-09-10 DIAGNOSIS — Z79891 Long term (current) use of opiate analgesic: Secondary | ICD-10-CM

## 2023-09-10 DIAGNOSIS — G8929 Other chronic pain: Secondary | ICD-10-CM

## 2023-09-10 MED ORDER — OXYCODONE HCL 10 MG PO TABS
10.0000 mg | ORAL_TABLET | Freq: Three times a day (TID) | ORAL | 0 refills | Status: DC | PRN
Start: 2023-09-13 — End: 2023-10-14

## 2023-09-10 NOTE — Assessment & Plan Note (Signed)
 Patient continues to work with physical therapy with improving mobility but continued chronic pain that has been responsive to a stable dose of oxycodone and he has added Tylenol to his regimen.  Discussed Tylenol dosing as well as other NSAIDs such as ibuprofen/naproxen.  Last tox assure from 01/2023 was appropriate and we will plan to repeat this at his next visit. - Continue oxycodone 10 mg 3 times daily with acetaminophen [phone] mg 3 times daily - Recommend naproxen 500 mg twice daily as needed for breakthrough pain and if taking more than 3 times a week he was instructed to let our clinic know
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
## 2023-09-10 NOTE — Progress Notes (Signed)
 [facility] Attending  Case discussed with the resident at the time of the visit.  We reviewed the resident's history and exam and pertinent patient test results.  I agree with the assessment, diagnosis, and plan of care documented in the resident's note.

## 2023-09-16 ENCOUNTER — Ambulatory Visit: Payer: Self-pay | Admitting: Physical Therapy

## 2023-09-25 ENCOUNTER — Ambulatory Visit: Payer: Medicare HMO | Admitting: Physical Medicine and Rehabilitation

## 2023-09-27 DIAGNOSIS — M21372 Foot drop, left foot: Secondary | ICD-10-CM | POA: Diagnosis not present

## 2023-09-27 DIAGNOSIS — M21172 Varus deformity, not elsewhere classified, left ankle: Secondary | ICD-10-CM | POA: Diagnosis not present

## 2023-09-30 ENCOUNTER — Ambulatory Visit: Payer: Self-pay | Admitting: Physical Therapy

## 2023-09-30 ENCOUNTER — Telehealth: Payer: Self-pay | Admitting: Neurology

## 2023-09-30 NOTE — Telephone Encounter (Signed)
 LVM and sent mychart msg informing pt of need to reschedule 11/19/23 appt - MD out

## 2023-09-30 NOTE — Telephone Encounter (Signed)
 Pt rescheduled 01/07/24 at 11:15 am

## 2023-10-02 ENCOUNTER — Ambulatory Visit: Admitting: Physical Therapy

## 2023-10-02 DIAGNOSIS — R2681 Unsteadiness on feet: Secondary | ICD-10-CM

## 2023-10-02 DIAGNOSIS — R262 Difficulty in walking, not elsewhere classified: Secondary | ICD-10-CM

## 2023-10-02 DIAGNOSIS — R269 Unspecified abnormalities of gait and mobility: Secondary | ICD-10-CM

## 2023-10-02 DIAGNOSIS — M6281 Muscle weakness (generalized): Secondary | ICD-10-CM | POA: Diagnosis not present
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
## 2023-10-04 ENCOUNTER — Ambulatory Visit: Payer: Medicare HMO | Admitting: Neurology

## 2023-10-08 ENCOUNTER — Telehealth: Payer: Self-pay | Admitting: Neurology

## 2023-10-08 ENCOUNTER — Other Ambulatory Visit: Payer: Self-pay | Admitting: Neurology

## 2023-10-08 DIAGNOSIS — G40019 Localization-related (focal) (partial) idiopathic epilepsy and epileptic syndromes with seizures of localized onset, intractable, without status epilepticus: Secondary | ICD-10-CM

## 2023-10-08 DIAGNOSIS — G40909 Epilepsy, unspecified, not intractable, without status epilepticus: Secondary | ICD-10-CM

## 2023-10-08 NOTE — Telephone Encounter (Signed)
 We can get a CT Head with contrast

## 2023-10-08 NOTE — Telephone Encounter (Signed)
 Called and stated CT Head with contrast  ordered pt agreeable
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
## 2023-10-09 ENCOUNTER — Inpatient Hospital Stay: Admission: RE | Admit: 2023-10-09 | Payer: Medicare HMO | Source: Ambulatory Visit

## 2023-10-11 ENCOUNTER — Other Ambulatory Visit: Payer: Self-pay | Admitting: Student

## 2023-10-11 DIAGNOSIS — M24562 Contracture, left knee: Secondary | ICD-10-CM

## 2023-10-11 NOTE — Telephone Encounter (Signed)
 Last rx written - 09/13/23. Last OV - 09/10/23. TOX - 01/15/23.

## 2023-10-11 NOTE — Telephone Encounter (Signed)
 Copied from CRM [id number]. Topic: Clinical - Medication Refill >> Oct 11, 2023  9:33 AM Yvone Neu wrote: Most Recent Primary Care Visit:  Provider: [name]  Department: [facility]  Visit Type: OPEN ESTABLISHED  Date: 09/10/2023  Medication: Oxycodone HCl 10 MG TABS  Has the patient contacted their pharmacy? Yes (Agent: If no, request that the patient contact the pharmacy for the refill. If patient does not wish to contact the pharmacy document the reason why and proceed with request.) (Agent: If yes, when and what did the pharmacy advise?)  Is this the correct pharmacy for this prescription? Yes If no, delete pharmacy and type the correct one.  This is the patient's preferred pharmacy:  [phone] [place], [place] - [address] [place] [address] Phone: [phone] Fax: [phone]   Has the prescription been filled recently? No  Is the patient out of the medication? No  Has the patient been seen for an appointment in the last year OR does the patient have an upcoming appointment? Yes  Can we respond through MyChart? Yes  Agent: Please be advised that Rx refills may take up to 3 business days. We ask that you follow-up with your pharmacy.

## 2023-10-13 ENCOUNTER — Other Ambulatory Visit: Payer: Self-pay | Admitting: Student

## 2023-10-13 DIAGNOSIS — M24562 Contracture, left knee: Secondary | ICD-10-CM

## 2023-10-14 ENCOUNTER — Other Ambulatory Visit: Payer: Self-pay | Admitting: Student

## 2023-10-14 DIAGNOSIS — M24562 Contracture, left knee: Secondary | ICD-10-CM

## 2023-10-14 NOTE — Telephone Encounter (Unsigned)
 Copied from CRM [id number]. Topic: Clinical - Medication Question >> Oct 14, 2023  8:46 AM Everette Rank wrote: Reason for CRM: Oxycodone HCl 10 MG TABS-Patient put it in 04/04 for refill but has not gone to the pharmacy yet. Let patient know it takes up to 3 days to fill. Patient is out by today. Needs call back on update [phone] Pharmacy: [id number] [place], [place] - [address] [place] [address] Phone: [phone] Fax: [phone] Hours: Not open 24 hours

## 2023-10-15 ENCOUNTER — Encounter: Payer: Self-pay | Admitting: Student

## 2023-10-15 MED ORDER — OXYCODONE HCL 10 MG PO TABS
10.0000 mg | ORAL_TABLET | Freq: Three times a day (TID) | ORAL | 0 refills | Status: DC | PRN
Start: 2023-10-15 — End: 2023-11-11

## 2023-10-15 NOTE — Telephone Encounter (Signed)
 [facility] who stated she received the Oxycodone rx this morning and just talked to pt. Also I called the pt who stated he did talked to [facility].

## 2023-10-15 NOTE — Telephone Encounter (Signed)
 Copied from CRM [id number]. Topic: Clinical - Prescription Issue >> Oct 15, 2023  8:31 AM Dennison Nancy wrote: Reason for CRM: Patient received a mychart message that his medication refill for the Oxycodone HCl 10 MG TABS , the medication showing discontinued Patient been waiting since last Friday  Please contact patient at [phone] to let know what is going on with his medication , patient is currently out of the medication

## 2023-10-16 ENCOUNTER — Telehealth: Payer: Self-pay | Admitting: Neurology

## 2023-10-16 NOTE — Telephone Encounter (Signed)
 Francine Graven Berkley Harvey: [id number] exp. 10/16/23-12/15/23 sent to GI [phone]

## 2023-10-21 ENCOUNTER — Encounter: Payer: Self-pay | Admitting: Physical Medicine and Rehabilitation

## 2023-10-21 ENCOUNTER — Encounter: Payer: Medicare HMO | Attending: Physical Medicine and Rehabilitation | Admitting: Physical Medicine and Rehabilitation

## 2023-10-21 VITALS — BP 115/68 | HR 61 | Ht 72.0 in | Wt 166.2 lb

## 2023-10-21 DIAGNOSIS — M21372 Foot drop, left foot: Secondary | ICD-10-CM | POA: Insufficient documentation

## 2023-10-21 DIAGNOSIS — G8310 Monoplegia of lower limb affecting unspecified side: Secondary | ICD-10-CM | POA: Diagnosis present
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
## 2023-10-28 ENCOUNTER — Ambulatory Visit: Payer: Self-pay | Attending: Internal Medicine | Admitting: Physical Therapy

## 2023-10-28 DIAGNOSIS — M6281 Muscle weakness (generalized): Secondary | ICD-10-CM | POA: Diagnosis not present

## 2023-10-28 DIAGNOSIS — R262 Difficulty in walking, not elsewhere classified: Secondary | ICD-10-CM | POA: Insufficient documentation

## 2023-10-28 DIAGNOSIS — R269 Unspecified abnormalities of gait and mobility: Secondary | ICD-10-CM | POA: Insufficient documentation

## 2023-10-28 DIAGNOSIS — R2681 Unsteadiness on feet: Secondary | ICD-10-CM | POA: Diagnosis not present
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
## 2023-11-06 ENCOUNTER — Ambulatory Visit
Admission: RE | Admit: 2023-11-06 | Discharge: 2023-11-06 | Disposition: A | Discharge status: Home / Self Care | Source: Ambulatory Visit | Attending: Neurology | Admitting: Neurology

## 2023-11-06 DIAGNOSIS — R569 Unspecified convulsions: Secondary | ICD-10-CM | POA: Diagnosis not present

## 2023-11-06 DIAGNOSIS — G40909 Epilepsy, unspecified, not intractable, without status epilepticus: Secondary | ICD-10-CM | POA: Diagnosis not present

## 2023-11-06 DIAGNOSIS — G40019 Localization-related (focal) (partial) idiopathic epilepsy and epileptic syndromes with seizures of localized onset, intractable, without status epilepticus: Secondary | ICD-10-CM

## 2023-11-06 MED ORDER — IOPAMIDOL (ISOVUE-300) INJECTION 61%
75.0000 mL | Freq: Once | INTRAVENOUS | Status: AC | PRN
  Administered 2023-11-06: 75 mL via INTRAVENOUS

## 2023-11-07 ENCOUNTER — Encounter: Payer: Self-pay | Admitting: Neurology

## 2023-11-11 ENCOUNTER — Other Ambulatory Visit: Payer: Self-pay | Admitting: Student

## 2023-11-11 DIAGNOSIS — M24562 Contracture, left knee: Secondary | ICD-10-CM

## 2023-11-11 NOTE — Telephone Encounter (Signed)
 Last rx written - 10/15/23. Last OV - 09/10/23. TOX - 01/15/23.

## 2023-11-11 NOTE — Telephone Encounter (Signed)
 Copied from CRM [id number]. Topic: Clinical - Medication Refill >> Nov 11, 2023  8:07 AM Danelle Dunning F wrote: Most Recent Primary Care Visit:  Provider: Cleven Dallas  Department: [facility]  Visit Type: OPEN ESTABLISHED  Date: 09/10/2023  Medication: Oxycodone  HCl 10 MG TABS  Has the patient contacted their pharmacy? Yes  Is this the correct pharmacy for this prescription? Yes  This is the patient's preferred pharmacy:  [phone], [place] - [address] [place] [address] Phone: [phone] Fax: [phone]   Has the prescription been filled recently? Yes  Is the patient out of the medication? Yes, he will be out tomorrow  Has the patient been seen for an appointment in the last year OR does the patient have an upcoming appointment? Yes  Can we respond through MyChart? Yes  Agent: Please be advised that Rx refills may take up to 3 business days. We ask that you follow-up with your pharmacy.

## 2023-11-12 ENCOUNTER — Ambulatory Visit: Payer: Self-pay | Attending: Internal Medicine | Admitting: Physical Therapy

## 2023-11-12 DIAGNOSIS — R2681 Unsteadiness on feet: Secondary | ICD-10-CM | POA: Insufficient documentation

## 2023-11-12 DIAGNOSIS — M6281 Muscle weakness (generalized): Secondary | ICD-10-CM | POA: Insufficient documentation

## 2023-11-12 DIAGNOSIS — R269 Unspecified abnormalities of gait and mobility: Secondary | ICD-10-CM | POA: Diagnosis not present

## 2023-11-12 DIAGNOSIS — R262 Difficulty in walking, not elsewhere classified: Secondary | ICD-10-CM | POA: Insufficient documentation

## 2023-11-12 MED ORDER — OXYCODONE HCL 10 MG PO TABS: 10.0000 mg | ORAL_TABLET | Freq: Three times a day (TID) | ORAL | 0 refills | Status: DC | PRN
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
## 2023-11-19 ENCOUNTER — Ambulatory Visit: Payer: Medicare HMO | Admitting: Neurology

## 2023-12-09 ENCOUNTER — Other Ambulatory Visit: Payer: Self-pay | Admitting: Student

## 2023-12-09 DIAGNOSIS — M24562 Contracture, left knee: Secondary | ICD-10-CM

## 2023-12-09 NOTE — Telephone Encounter (Signed)
 Last Fill: 11/12/23 90 tabs/0 RF  Last OV: 09/10/23 Next OV: None Scheduled  Routing to provider for review/authorization.

## 2023-12-09 NOTE — Telephone Encounter (Unsigned)
 Copied from CRM [id number]. Topic: Clinical - Medication Refill >> Dec 09, 2023  9:30 AM Blair Bumpers wrote: Medication: Oxycodone  HCl 10 MG TABS   Has the patient contacted their pharmacy? No (Agent: If no, request that the patient contact the pharmacy for the refill. If patient does not wish to contact the pharmacy document the reason why and proceed with request.) (Agent: If yes, when and what did the pharmacy advise?)  This is the patient's preferred pharmacy:  [phone], [place] - [address] [place] [address] Phone: [phone] Fax: [phone]  Is this the correct pharmacy for this prescription? Yes If no, delete pharmacy and type the correct one.   Has the prescription been filled recently? Yes  Is the patient out of the medication? Yes  Has the patient been seen for an appointment in the last year OR does the patient have an upcoming appointment? Yes  Can we respond through MyChart? Yes  Agent: Please be advised that Rx refills may take up to 3 business days. We ask that you follow-up with your pharmacy.

## 2023-12-11 MED ORDER — OXYCODONE HCL 10 MG PO TABS: 10.0000 mg | ORAL_TABLET | Freq: Three times a day (TID) | ORAL | 0 refills | Status: DC | PRN

## 2024-01-07 ENCOUNTER — Ambulatory Visit (INDEPENDENT_AMBULATORY_CARE_PROVIDER_SITE_OTHER): Admitting: Neurology

## 2024-01-07 ENCOUNTER — Encounter: Payer: Self-pay | Admitting: Neurology

## 2024-01-07 ENCOUNTER — Other Ambulatory Visit: Payer: Self-pay | Admitting: Student

## 2024-01-07 VITALS — BP 118/66 | HR 74 | Resp 15 | Ht 70.0 in

## 2024-01-07 DIAGNOSIS — G40019 Localization-related (focal) (partial) idiopathic epilepsy and epileptic syndromes with seizures of localized onset, intractable, without status epilepticus: Secondary | ICD-10-CM | POA: Diagnosis not present

## 2024-01-07 DIAGNOSIS — M24562 Contracture, left knee: Secondary | ICD-10-CM

## 2024-01-07 DIAGNOSIS — Z5181 Encounter for therapeutic drug level monitoring: Secondary | ICD-10-CM | POA: Diagnosis not present

## 2024-01-07 NOTE — Patient Instructions (Signed)
 Continue with Levetiracetam  2000 mg twice daily  Continue with Lacosamide  200 mg twice daily  Will check level  Please call for any additional questions or seizures  Return in 6 to 8 months
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
## 2024-01-07 NOTE — Telephone Encounter (Unsigned)
 Copied from CRM [id number]. Topic: Clinical - Medication Refill >> Jan 07, 2024  8:06 AM Rosaria A wrote: Medication: Oxycodone  HCl 10 MG TABS  Has the patient contacted their pharmacy? No  This is the patient's preferred pharmacy:  [phone] [address], [place] - [address] [place] [address] Phone: [phone] Fax: [phone]  Is this the correct pharmacy for this prescription? Yes   Has the prescription been filled recently? Yes Last filled 12/11/23  Is the patient out of the medication? No. Will be out on Thursday or Friday.   Has the patient been seen for an appointment in the last year OR does the patient have an upcoming appointment? Yes  Can we respond through MyChart? No  Agent: Please be advised that Rx refills may take up to 3 business days. We ask that you follow-up with your pharmacy.

## 2024-01-08 MED ORDER — OXYCODONE HCL 10 MG PO TABS: 10.0000 mg | ORAL_TABLET | Freq: Three times a day (TID) | ORAL | 0 refills | Status: DC | PRN

## 2024-02-06 ENCOUNTER — Other Ambulatory Visit: Payer: Self-pay | Admitting: Student

## 2024-02-06 DIAGNOSIS — M24562 Contracture, left knee: Secondary | ICD-10-CM

## 2024-02-06 NOTE — Telephone Encounter (Signed)
 01/08/24 90 tabs/0 RF

## 2024-02-06 NOTE — Telephone Encounter (Signed)
 Copied from CRM [id number]. Topic: Clinical - Medication Refill >> Feb 06, 2024  9:40 AM Adrianna P wrote: Medication: Oxycodone  HCl 10 MG TABS  Has the patient contacted their pharmacy? No (Agent: If no, request that the patient contact the pharmacy for the refill. If patient does not wish to contact the pharmacy document the reason why and proceed with request.) (Agent: If yes, when and what did the pharmacy advise?)  This is the patient's preferred pharmacy:  [phone] [address] [facility], [place] - [address] [place] [address] Phone: [phone] Fax: [phone]  Is this the correct pharmacy for this prescription? Yes If no, delete pharmacy and type the correct one.   Has the prescription been filled recently? No  Is the patient out of the medication? No  Has the patient been seen for an appointment in the last year OR does the patient have an upcoming appointment? Yes  Can we respond through MyChart? Yes  Agent: Please be advised that Rx refills may take up to 3 business days. We ask that you follow-up with your pharmacy.

## 2024-02-08 MED ORDER — OXYCODONE HCL 10 MG PO TABS: 10.0000 mg | ORAL_TABLET | Freq: Three times a day (TID) | ORAL | 0 refills | Status: DC | PRN

## 2024-02-10 ENCOUNTER — Telehealth: Payer: Self-pay | Admitting: *Deleted

## 2024-02-10 NOTE — Telephone Encounter (Signed)
 Prescription was sent in on 02/08/2024. Copied from CRM [id number]. Topic: Clinical - Medication Refill >> Feb 06, 2024  9:40 AM Adrianna P wrote: Medication: Oxycodone  HCl 10 MG TABS  Has the patient contacted their pharmacy? No (Agent: If no, request that the patient contact the pharmacy for the refill. If patient does not wish to contact the pharmacy document the reason why and proceed with request.) (Agent: If yes, when and what did the pharmacy advise?)  This is the patient's preferred pharmacy:  [phone] [address] [facility], [place] - [address] [place] [address] Phone: [phone] Fax: [phone]  Is this the correct pharmacy for this prescription? Yes If no, delete pharmacy and type the correct one.   Has the prescription been filled recently? No  Is the patient out of the medication? No  Has the patient been seen for an appointment in the last year OR does the patient have an upcoming appointment? Yes  Can we respond through MyChart? Yes  Agent: Please be advised that Rx refills may take up to 3 business days. We ask that you follow-up with your pharmacy. >> Feb 07, 2024  3:38 PM Alfonso ORN wrote: Patient call on status of the medication refill for the Oxycodone  HCl 10 MG TABS ,. Patient access rep let patient know it may take 3 business day and to followup with the pharmacy  Patient is currently out of the medication  Please let patient know the status of the medication refill  Pt. Call back number [phone]

## 2024-02-21 ENCOUNTER — Other Ambulatory Visit: Payer: Self-pay | Admitting: Neurology

## 2024-02-24 NOTE — Telephone Encounter (Signed)
 Requested Prescriptions   Pending Prescriptions Disp Refills   Lacosamide  100 MG TABS [Pharmacy Med Name: LACOSAMIDE  100 MG TABLET] 60 tablet     Sig: TAKE 1 TABLET BY MOUTH TWICE A DAY   Last seen 01/07/24, next appt 08/26/24 Dispenses   Dispensed Days Supply Quantity Provider Pharmacy  LACOSAMIDE  100 MG TABLET 01/23/2024 30 60 each Camara, Amadou, MD [phone]...  LACOSAMIDE  100 MG TABLET 12/25/2023 30 60 each Camara, Amadou, MD [facility] [phone] - [facility]...  LACOSAMIDE  100 MG TABLET 11/23/2023 30 60 each Camara, Amadou, MD [facility] [phone] - G...  LACOSAMIDE  100 MG TABLET 10/24/2023 30 60 each Camara, Amadou, MD [facility] [phone] - G...  LACOSAMIDE  100 MG TABLET 09/21/2023 30 60 each Camara, Amadou, MD [facility] [phone] - G...  LACOSAMIDE  100 MG TABLET 08/22/2023 30 60 each Camara, Amadou, MD [facility] [phone] - G...  LACOSAMIDE  50 MG TABLET 07/23/2023 30 60 each Camara, Amadou, MD [facility] [phone] - G...  LACOSAMIDE  50 MG TABLET 06/19/2023 30 60 each Gregg Lek, MD [facility] [phone] - [facility]

## 2024-03-05 ENCOUNTER — Other Ambulatory Visit: Payer: Self-pay | Admitting: Student

## 2024-03-05 DIAGNOSIS — M24562 Contracture, left knee: Secondary | ICD-10-CM

## 2024-03-05 NOTE — Telephone Encounter (Signed)
 Last rx written - 02/08/24. Last OV - 09/10/23. Next OV - 03/30/24. TOX - 01/15/23.

## 2024-03-05 NOTE — Telephone Encounter (Unsigned)
 Copied from CRM [id number]. Topic: Clinical - Medication Refill >> Mar 05, 2024 12:54 PM Mercer PEDLAR wrote: Medication: Oxycodone  HCl 10 MG TABS  Has the patient contacted their pharmacy? Yes (Agent: If no, request that the patient contact the pharmacy for the refill. If patient does not wish to contact the pharmacy document the reason why and proceed with request.) (Agent: If yes, when and what did the pharmacy advise?)  This is the patient's preferred pharmacy:  [phone] [address] [facility] [place] - [address] [place] [address] Phone: [phone] Fax: [phone]  Is this the correct pharmacy for this prescription? Yes If no, delete pharmacy and type the correct one.   Has the prescription been filled recently? No  Is the patient out of the medication? No  Has the patient been seen for an appointment in the last year OR does the patient have an upcoming appointment? Yes  Can we respond through MyChart? Yes  Agent: Please be advised that Rx refills may take up to 3 business days. We ask that you follow-up with your pharmacy.

## 2024-03-06 MED ORDER — OXYCODONE HCL 10 MG PO TABS: 10.0000 mg | ORAL_TABLET | Freq: Three times a day (TID) | ORAL | 0 refills | Status: DC | PRN

## 2024-03-23 ENCOUNTER — Other Ambulatory Visit: Payer: Self-pay | Admitting: *Deleted

## 2024-03-23 MED ORDER — GABAPENTIN 400 MG PO CAPS: ORAL_CAPSULE | ORAL | 5 refills | Status: AC

## 2024-03-30 ENCOUNTER — Telehealth (INDEPENDENT_AMBULATORY_CARE_PROVIDER_SITE_OTHER): Payer: Self-pay | Admitting: Student

## 2024-03-30 DIAGNOSIS — Z79891 Long term (current) use of opiate analgesic: Secondary | ICD-10-CM

## 2024-03-30 DIAGNOSIS — G40909 Epilepsy, unspecified, not intractable, without status epilepticus: Secondary | ICD-10-CM

## 2024-03-30 DIAGNOSIS — Z113 Encounter for screening for infections with a predominantly sexual mode of transmission: Secondary | ICD-10-CM | POA: Insufficient documentation

## 2024-03-30 NOTE — Patient Instructions (Addendum)
 Thank you, [name], for allowing us  to provide your care today. Today we discussed . . .  > Chronic pain       - I am not going to change your medications for your pain but you can add naproxen 200-500 mg twice daily for breakthrough pain.  If you are using this medication please let us  know if it is more than 2 times a week.  You will also need to come to the clinic in the next few weeks to give a urine sample.  We will also add STD testing to your urine and we will get some blood when you come in. > Vaccines       - Please ask your pharmacy about the flu vaccine, pneumococcal vaccine, and updated COVID-vaccine.  We recommend that you get all of these vaccines whenever you are able to.  If you find out about your hepatitis vaccine please let us  know but if you are unable to then we will test to see if you are immune to hepatitis B at your next visit and then start the vaccine series if needed.  Follow up: 3 months    Remember:  Should you have any questions or concerns please call the internal medicine clinic at [phone].     Fairy Pool, DO [facility]
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
## 2024-03-30 NOTE — Assessment & Plan Note (Signed)
 Continues to follow with neurology with his next follow-up in February 2026.  Continues to do well on Keppra  and Vimpat  without any recurrent seizures over the past 6 months.

## 2024-03-30 NOTE — Assessment & Plan Note (Signed)
 Patient requests screening STD testing.  He is not having any symptoms and continues to have unprotected sex with the same partner. - Future labs for urine GC and trichomonas as well as RPR and HIV blood test

## 2024-03-30 NOTE — Assessment & Plan Note (Signed)
 Continues to do well on current regimen of oxycodone  10 mg 3 times daily and acetaminophen  [phone] mg 3 times daily as needed.  He has finished physical therapy but continues to do the exercises on his own and wears a back brace when leaving the house.  Discussed using naproxen 200-500 mg twice daily for breakthrough pain but with the recommendation that he tells us  if he is using this more than twice a week.  Also discussed the need to get a tox assure and that her next visit is in person in about 3 months. - Continue oxycodone  10 mg 3 times daily and acetaminophen  [phone] mg 3 times daily as needed - Tox assure ordered for the future

## 2024-04-01 NOTE — Addendum Note (Signed)
 Addended by: KARNA FELLOWS on: 04/01/2024 09:04 AM   Modules accepted: Level of Service

## 2024-04-01 NOTE — Progress Notes (Signed)
 [facility] Attending  Case discussed with the resident at the time of the visit.  We reviewed the resident's history and exam and pertinent patient test results.  I agree with the assessment, diagnosis, and plan of care documented in the resident's note.

## 2024-04-02 ENCOUNTER — Encounter: Payer: Self-pay | Admitting: Neurology

## 2024-04-06 NOTE — Telephone Encounter (Signed)
 Can we clarify with patient what he needs. Thanks

## 2024-04-07 ENCOUNTER — Other Ambulatory Visit: Payer: Self-pay | Admitting: Student

## 2024-04-07 ENCOUNTER — Ambulatory Visit: Admitting: Student

## 2024-04-07 ENCOUNTER — Ambulatory Visit: Payer: Self-pay

## 2024-04-07 DIAGNOSIS — M24562 Contracture, left knee: Secondary | ICD-10-CM

## 2024-04-07 MED ORDER — OXYCODONE HCL 10 MG PO TABS: 10.0000 mg | ORAL_TABLET | Freq: Three times a day (TID) | ORAL | 0 refills | Status: DC | PRN

## 2024-04-07 NOTE — Telephone Encounter (Signed)
 Copied from CRM #[id number]. Topic: Clinical - Medication Refill >> Apr 07, 2024  8:31 AM Alfonso ORN wrote: Medication: Oxycodone  HCl 10 MG TABS  Has the patient contacted their pharmacy? Yes (Agent: If no, request that the patient contact the pharmacy for the refill. If patient does not wish to contact the pharmacy document the reason why and proceed with request.) (Agent: If yes, when and what did the pharmacy advise?)  This is the patient's preferred pharmacy:  [phone] [address] [place] - [address] [place] [address] Phone: [phone] Fax: [phone]  Is this the correct pharmacy for this prescription? Yes If no, delete pharmacy and type the correct one.   Has the prescription been filled recently? No  Is the patient out of the medication? No , will be out on  04/08/24  Has the patient been seen for an appointment in the last year OR does the patient have an upcoming appointment? Yes  Can we respond through MyChart? Yes  Agent: Please be advised that Rx refills may take up to 3 business days. We ask that you follow-up with your pharmacy.

## 2024-04-07 NOTE — Telephone Encounter (Signed)
 Call to patient states is having swelling in his left elbow. Was red.  Not as bad now.  Offered appointment for today.  Unable to come due to class.

## 2024-04-07 NOTE — Telephone Encounter (Signed)
 FYI Only or Action Required?: FYI only for provider.  Patient was last seen in primary care on 03/30/2024 by Jolaine Pac, DO.  Called Nurse Triage reporting Joint Swelling.  Symptoms began a week ago.  Interventions attempted: Nothing.  Symptoms are: unchanged.  Triage Disposition: See PCP Within 2 Weeks  Patient/caregiver understands and will follow disposition?: Yes     Copied from CRM [id number]. Topic: Clinical - Red Word Triage >> Apr 07, 2024  8:37 AM Alfonso ORN wrote: Red Word that prompted transfer to Nurse Triage: right elbow swelling    (Pt. request to reschedule his appt. On 04/15/24 ) Reason for Disposition  [1] MILD pain (e.g., does not interfere with normal activities) AND [2] present > 7 days  Answer Assessment - Initial Assessment Questions 1. ONSET: When did the pain start?     Thursday or Friday  2. LOCATION: Where is the pain located?     Right elbow 3. PAIN: How bad is the pain? (Scale 1-10; or mild, moderate, severe)     mild 4. WORK OR EXERCISE: Has there been any recent work or exercise that involved this part of the body?     denies 5. CAUSE: What do you think is causing the elbow pain?     no 6. OTHER SYMPTOMS: Do you have any other symptoms? (e.g., neck pain, elbow swelling, rash, fever)     swelling 7. PREGNANCY: Is there any chance you are pregnant? When was your last menstrual period?     na  Protocols used: Elbow Pain-A-AH

## 2024-04-08 ENCOUNTER — Telehealth: Payer: Self-pay | Admitting: *Deleted

## 2024-04-08 DIAGNOSIS — M24562 Contracture, left knee: Secondary | ICD-10-CM

## 2024-04-08 MED ORDER — OXYCODONE HCL 10 MG PO TABS: 10.0000 mg | ORAL_TABLET | Freq: Three times a day (TID) | ORAL | 0 refills | Status: DC | PRN

## 2024-04-08 NOTE — Telephone Encounter (Signed)
 Call to pharmacy.  Prescription is ready for pick up.  Attempts to call patient.  Message left that the pharmacy is taking care of his prescription and getting it ready for pick up. Copied from CRM [id number]. Topic: Clinical - Prescription Issue >> Apr 08, 2024  8:01 AM Farrel B wrote: Reason for CRM: patient has called requesting a call back in regards to the the refill Oxycodone  HCl 10 MG TABS5 patient stated the pharmacy called informing him they needed more information on the prescription. Please call patient to advise at Oxycodone  HCl 10 MG TABS   ----------------------------------------------------------------------- From previous Reason for Contact - Pink Word Triage: Reason for Triage: >> Apr 08, 2024  9:08 AM Graeme ORN wrote: Patient called back. States pharmacy called him back. Received Rx but needs more information. He runs out of medication today. Would like to have this resolved as soon as possible. Thank You

## 2024-04-08 NOTE — Telephone Encounter (Signed)
 Re-sent Oxycodone  given problems with previous DEA number.

## 2024-04-08 NOTE — Telephone Encounter (Signed)
 Per pharmacy Dr.King [place]. Pharmacy is requesting a new rx to be sent in.   (Rx needs to be sent by [facility]

## 2024-04-09 NOTE — Telephone Encounter (Signed)
 Call to Pharmacy prescription was picked up.

## 2024-04-13 ENCOUNTER — Ambulatory Visit (INDEPENDENT_AMBULATORY_CARE_PROVIDER_SITE_OTHER): Payer: Self-pay

## 2024-04-13 ENCOUNTER — Telehealth: Payer: Self-pay | Admitting: Neurology

## 2024-04-13 ENCOUNTER — Other Ambulatory Visit (HOSPITAL_COMMUNITY)
Admission: RE | Admit: 2024-04-13 | Discharge: 2024-04-13 | Disposition: A | Discharge status: Home / Self Care | Source: Ambulatory Visit | Attending: Internal Medicine | Admitting: Internal Medicine

## 2024-04-13 VITALS — BP 117/61 | HR 68 | Temp 98.8°F | Ht 70.0 in | Wt 166.2 lb

## 2024-04-13 DIAGNOSIS — M25421 Effusion, right elbow: Secondary | ICD-10-CM | POA: Insufficient documentation

## 2024-04-13 DIAGNOSIS — Z79891 Long term (current) use of opiate analgesic: Secondary | ICD-10-CM | POA: Diagnosis not present

## 2024-04-13 DIAGNOSIS — Z79899 Other long term (current) drug therapy: Secondary | ICD-10-CM

## 2024-04-13 DIAGNOSIS — M25521 Pain in right elbow: Secondary | ICD-10-CM | POA: Diagnosis not present

## 2024-04-13 DIAGNOSIS — Z5181 Encounter for therapeutic drug level monitoring: Secondary | ICD-10-CM | POA: Diagnosis not present

## 2024-04-13 DIAGNOSIS — G40019 Localization-related (focal) (partial) idiopathic epilepsy and epileptic syndromes with seizures of localized onset, intractable, without status epilepticus: Secondary | ICD-10-CM

## 2024-04-13 DIAGNOSIS — Z113 Encounter for screening for infections with a predominantly sexual mode of transmission: Secondary | ICD-10-CM

## 2024-04-13 NOTE — Telephone Encounter (Signed)
 Pt dropped off DMV paperwork to be filled out, paid $50 form fee and I dropped off in POD 2

## 2024-04-13 NOTE — Assessment & Plan Note (Signed)
 From previous visit.  Patient states that he has not had any new partners but would like to get tested.  No acute concerns for infection at this time

## 2024-04-13 NOTE — Assessment & Plan Note (Signed)
 States this been going on for 1 week but is better at today's visit.  Some residual pain but is manageable and patient has intact range of motion.  Vital signs are stable and in the absence of hemodynamic instability or other fever and chills do think this could have been acute inflammation from contact irritation to the area.  Differential includes olecranon bursitis.  Seems to be self resolving  Plan: Advised patient that he can take ibuprofen  200 to 300 mg every 6 hours as needed to help with pain and avoid contact with the region and see if this resolves. Did advise patient that if he starts having fevers or chills, worsening swelling to please let us  know

## 2024-04-13 NOTE — Patient Instructions (Addendum)
 Today we discussed the following medical conditions and plan:   For your elbow, I have less concern for infection and believe that you probably just had some irritation to your elbow that caused your symptoms.  If you start having fevers or chills or other joints start hurting please call us  and let us  know.  In the meantime you can take ibuprofen  200 to 300 mg about 4 times a day to help control the pain.  We look forward to seeing you next time. Please call our clinic at [phone] if you have any questions or concerns. The best time to call is Monday-Friday from 9am-4pm, but there is someone available 24/7. If you need medication refills, please notify your pharmacy one week in advance and they will send us  a request.   Thank you for trusting me with your care. Wishing you the best! We would like to see you back in 3 months Remonia Romano, DO  [facility]
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
## 2024-04-14 DIAGNOSIS — Z0289 Encounter for other administrative examinations: Secondary | ICD-10-CM

## 2024-04-14 LAB — URINE CYTOLOGY ANCILLARY ONLY
Chlamydia: NEGATIVE
Comment: NEGATIVE
Comment: NEGATIVE
Comment: NORMAL
Neisseria Gonorrhea: NEGATIVE
Trichomonas: NEGATIVE

## 2024-04-14 LAB — RPR: RPR Ser Ql: NONREACTIVE

## 2024-04-14 LAB — HIV ANTIBODY (ROUTINE TESTING W REFLEX): HIV Screen 4th Generation wRfx: NONREACTIVE

## 2024-04-15 ENCOUNTER — Ambulatory Visit: Payer: Self-pay

## 2024-04-15 ENCOUNTER — Telehealth: Payer: Self-pay | Admitting: *Deleted

## 2024-04-15 ENCOUNTER — Ambulatory Visit: Admitting: Student

## 2024-04-15 NOTE — Telephone Encounter (Signed)
 Pt saw Dr Kem 10/6, did you call pt?

## 2024-04-15 NOTE — Addendum Note (Signed)
 Addended by: KARNA FELLOWS on: 04/15/2024 09:20 AM   Modules accepted: Level of Service

## 2024-04-15 NOTE — Telephone Encounter (Signed)
 Thank you :)

## 2024-04-15 NOTE — Progress Notes (Signed)
 Internal Medicine Clinic Attending  I was physically present during the key portions of the resident provided service and participated in the medical decision making of patient's management care. I reviewed pertinent patient test results.  The assessment, diagnosis, and plan were formulated together and I agree with the documentation in the resident's note.  Dickie La, MD

## 2024-04-15 NOTE — Telephone Encounter (Signed)
 Copied from CRM #[id number]. Topic: General - Call Back - No Documentation >> Apr 15, 2024  2:47 PM Merlynn A wrote: Reason for CRM: Patient called in returning call. No documentation left. Please contact patient at [phone].

## 2024-04-16 ENCOUNTER — Telehealth: Payer: Self-pay | Admitting: *Deleted

## 2024-04-16 LAB — TOXASSURE SELECT,+ANTIDEPR,UR

## 2024-04-16 NOTE — Telephone Encounter (Signed)
 Pt dmv form faxed on 04/16/2024

## 2024-04-18 LAB — LACOSAMIDE: Lacosamide: 4.8 ug/mL — ABNORMAL LOW (ref 5.0–10.0)

## 2024-04-20 ENCOUNTER — Ambulatory Visit: Payer: Self-pay | Admitting: Neurology

## 2024-05-06 ENCOUNTER — Other Ambulatory Visit: Payer: Self-pay | Admitting: Student

## 2024-05-06 DIAGNOSIS — M24562 Contracture, left knee: Secondary | ICD-10-CM

## 2024-05-06 NOTE — Telephone Encounter (Unsigned)
 Copied from CRM [id number]. Topic: Clinical - Medication Refill >> May 06, 2024  8:03 AM Cherylann RAMAN wrote: Medication: Oxycodone  HCl 10 MG TABS  Has the patient contacted their pharmacy? No (Agent: If no, request that the patient contact the pharmacy for the refill. If patient does not wish to contact the pharmacy document the reason why and proceed with request.) (Agent: If yes, when and what did the pharmacy advise?)  This is the patient's preferred [facility]:  [phone] [address] [place] - [address] [place] [address] Phone: [phone] Fax: [phone]  Is this the correct pharmacy for this prescription? Yes If no, delete pharmacy and type the correct one.   Has the prescription been filled recently? No  Is the patient out of the medication? No  Has the patient been seen for an appointment in the last year OR does the patient have an upcoming appointment? Yes  Can we respond through MyChart? Yes  Agent: Please be advised that Rx refills may take up to 3 business days. We ask that you follow-up with your pharmacy.

## 2024-05-07 NOTE — Telephone Encounter (Signed)
 Patient said received a letter from [facility] have not received form. Would like a call back to verify have been sent.

## 2024-05-07 NOTE — Telephone Encounter (Signed)
 Pt called to confirm the date the form was faxed to the [facility]

## 2024-05-08 MED ORDER — OXYCODONE HCL 10 MG PO TABS: 10.0000 mg | ORAL_TABLET | Freq: Three times a day (TID) | ORAL | 0 refills | Status: DC | PRN

## 2024-05-19 NOTE — Telephone Encounter (Signed)
 Pt called stating that DMV reached out to Pt , Stating they are missing second form  from Paperwork that was sent over . PT is requesting for  forms to be re faxed  over to [facility]

## 2024-05-20 ENCOUNTER — Telehealth: Payer: Self-pay | Admitting: *Deleted

## 2024-05-20 NOTE — Telephone Encounter (Signed)
 Patient called check if form has been faxed. Informed patient form was refax today 05/20/24.

## 2024-05-20 NOTE — Telephone Encounter (Signed)
 I refax pt dmv form on 05/20/2024

## 2024-07-05 ENCOUNTER — Other Ambulatory Visit: Payer: Self-pay | Admitting: Student

## 2024-07-05 DIAGNOSIS — M24562 Contracture, left knee: Secondary | ICD-10-CM

## 2024-07-05 MED ORDER — OXYCODONE HCL 10 MG PO TABS: 10.0000 mg | ORAL_TABLET | Freq: Three times a day (TID) | ORAL | 0 refills | Status: DC | PRN

## 2024-07-05 NOTE — Progress Notes (Signed)
 After hours page for refill of oxycodone . PDMP appropriate. Pt has supply to last until 12/30. [facility] [address]

## 2024-07-08 ENCOUNTER — Ambulatory Visit

## 2024-07-08 VITALS — Wt 160.0 lb

## 2024-07-08 DIAGNOSIS — Z0000 Encounter for general adult medical examination without abnormal findings: Secondary | ICD-10-CM
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
## 2024-07-08 NOTE — Patient Instructions (Signed)
 Jonathan [name],  Thank you for taking the time for your Medicare Wellness Visit. I appreciate your continued commitment to your health goals. Please review the care plan we discussed, and feel free to reach out if I can assist you further.  Please note that Annual Wellness Visits do not include a physical exam. Some assessments may be limited, especially if the visit was conducted virtually. If needed, we may recommend an in-person follow-up with your provider.  Ongoing Care Seeing your primary care provider every 3 to 6 months helps us  monitor your health and provide consistent, personalized care.   Referrals If a referral was made during today's visit and you haven't received any updates within two weeks, please contact the referred provider directly to check on the status.  Recommended Screenings:  Health Maintenance  Topic Date Due   Medicare Annual Wellness Visit  Never done   COVID-19 Vaccine (1) Never done   Pneumococcal Vaccine (1 of 2 - PCV) Never done   Hepatitis B Vaccine (1 of 3 - 19+ 3-dose series) Never done   Flu Shot  Never done   DTaP/Tdap/Td vaccine (2 - Td or Tdap) 10/20/2030   HPV Vaccine  Completed   Hepatitis C Screening  Completed   HIV Screening  Completed   Meningitis B Vaccine  Aged Out       07/08/2024    9:20 AM  Advanced Directives  Does Patient Have a Medical Advance Directive? No  Would patient like information on creating a medical advance directive? No - Patient declined    Vision: Annual vision screenings are recommended for early detection of glaucoma, cataracts, and diabetic retinopathy. These exams can also reveal signs of chronic conditions such as diabetes and high blood pressure.  Dental: Annual dental screenings help detect early signs of oral cancer, gum disease, and other conditions linked to overall health, including heart disease and diabetes.  Please see the attached documents for additional preventive care recommendations.

## 2024-07-14 ENCOUNTER — Other Ambulatory Visit: Payer: Self-pay

## 2024-07-14 ENCOUNTER — Ambulatory Visit: Payer: Self-pay | Admitting: Student

## 2024-07-14 ENCOUNTER — Encounter: Payer: Self-pay | Admitting: Student

## 2024-07-14 VITALS — BP 124/70 | HR 90 | Temp 99.0°F | Ht 72.0 in | Wt 161.8 lb

## 2024-07-14 DIAGNOSIS — M79605 Pain in left leg: Secondary | ICD-10-CM | POA: Diagnosis not present

## 2024-07-14 DIAGNOSIS — Z79891 Long term (current) use of opiate analgesic: Secondary | ICD-10-CM

## 2024-07-14 DIAGNOSIS — I471 Supraventricular tachycardia, unspecified: Secondary | ICD-10-CM

## 2024-07-14 DIAGNOSIS — G8921 Chronic pain due to trauma: Secondary | ICD-10-CM
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
## 2024-07-16 DIAGNOSIS — G8921 Chronic pain due to trauma: Secondary | ICD-10-CM | POA: Insufficient documentation

## 2024-07-16 LAB — MED LIST OPTION NOT SELECTED

## 2024-07-16 LAB — TOXASSURE SELECT,+ANTIDEPR,UR

## 2024-07-16 NOTE — Assessment & Plan Note (Signed)
 Continues to do well on current therapy.  Last tox assure 04/13/2024 with [facility], discussed previously.  Due to his stability and time from completing physical therapy we discussed tapering off oxycodone .  He reports trying oxycodone  5 mg for some of his doses while he was going to school and reports that he needed more Tylenol  during those times.  He is hesitant to do this decreased again during the colder months as his pain is usually worse during this time.  We discussed the risk, benefits, and alternatives to chronic opiate therapy and in the next 3 to 6 months he is willing to try decreasing or spacing out his oxycodone .  He is also prescribed gabapentin  800 mg 3 times daily and we will discuss decreasing this if able at future visits. - Tox assure today - Continue oxycodone  10 mg every 8 hours as needed, gabapentin  800 mg 3 times daily as needed, and Tylenol  1000 mg every 8 hours as needed - Return in 3 months to reassess medication titration

## 2024-07-20 NOTE — Progress Notes (Signed)
 [facility] Attending  Case discussed with the resident at the time of the visit.  We reviewed the resident's history and exam and pertinent patient test results.  I agree with the assessment, diagnosis, and plan of care documented in the resident's note.

## 2024-07-21 ENCOUNTER — Telehealth: Payer: Self-pay

## 2024-07-21 NOTE — Telephone Encounter (Signed)
 Botox  approved 07/09/2024-07/08/2025 Mzq#[id number]

## 2024-08-04 ENCOUNTER — Telehealth: Payer: Self-pay | Admitting: *Deleted

## 2024-08-04 NOTE — Telephone Encounter (Unsigned)
 Copied from CRM #[id number]. Topic: Clinical - Medication Refill >> Aug 04, 2024  8:12 AM Miquel SAILOR wrote: Medication: Oxycodone  HCl 10 MG TABS   Has the patient contacted their pharmacy? Yes (Agent: If no, request that the patient contact the pharmacy for the refill. If patient does not wish to contact the pharmacy document the reason why and proceed with request.) (Agent: If yes, when and what did the pharmacy advise?)  This is the patient's preferred pharmacy:  [facility] [phone] [address], [place] - [address] [place] [address] Phone: [phone] Fax: [phone]  Is this the correct pharmacy for this prescription? Yes If no, delete pharmacy and type the correct one.   Has the prescription been filled recently? Yes  Is the patient out of the medication? Yes  Has the patient been seen for an appointment in the last year OR does the patient have an upcoming appointment? Yes  Can we respond through MyChart? Yes  Agent: Please be advised that Rx refills may take up to 3 business days. We ask that you follow-up with your pharmacy.

## 2024-08-05 ENCOUNTER — Telehealth: Payer: Self-pay | Admitting: *Deleted

## 2024-08-05 DIAGNOSIS — M24562 Contracture, left knee: Secondary | ICD-10-CM

## 2024-08-05 NOTE — Telephone Encounter (Signed)
 Will forward to PCP.                         Copied from CRM #[id number]. Topic: Clinical - Medication Refill >> Aug 04, 2024  8:12 AM Miquel SAILOR wrote: Medication: Oxycodone  HCl 10 MG TABS   Has the patient contacted their pharmacy? Yes (Agent: If no, request that the patient contact the pharmacy for the refill. If patient does not wish to contact the pharmacy document the reason why and proceed with request.) (Agent: If yes, when and what did the pharmacy advise?)  This is the patient's preferred pharmacy:  [facility] [phone] [address], [place] - [address] [place] [address] Phone: [phone] Fax: [phone]  Is this the correct pharmacy for this prescription? Yes If no, delete pharmacy and type the correct one.   Has the prescription been filled recently? Yes  Is the patient out of the medication? Yes  Has the patient been seen for an appointment in the last year OR does the patient have an upcoming appointment? Yes  Can we respond through MyChart? Yes  Agent: Please be advised that Rx refills may take up to 3 business days. We ask that you follow-up with your pharmacy. >> Aug 05, 2024  9:18 AM Susanna ORN wrote: Patient calling to get an update on his prescription refill request. Informed him that the request was sent over to provider & we are awaiting signature & approval.

## 2024-08-06 MED ORDER — OXYCODONE HCL 10 MG PO TABS: 10.0000 mg | ORAL_TABLET | Freq: Three times a day (TID) | ORAL | 0 refills | Status: AC | PRN
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
## 2024-08-26 ENCOUNTER — Ambulatory Visit: Admitting: Neurology

## 2024-10-12 ENCOUNTER — Ambulatory Visit: Payer: Self-pay
